# Patient Record
Sex: Female | Born: 1995 | Race: White | Hispanic: No | Marital: Single | Smoking: Never smoker
Health system: Southern US, Community
[De-identification: ages and names within clinical notes are randomized; demographics above are authoritative.]

## PROBLEM LIST (undated history)

## (undated) DIAGNOSIS — E538 Deficiency of other specified B group vitamins: Secondary | ICD-10-CM

## (undated) DIAGNOSIS — T7840XA Allergy, unspecified, initial encounter: Secondary | ICD-10-CM

## (undated) DIAGNOSIS — G43909 Migraine, unspecified, not intractable, without status migrainosus: Secondary | ICD-10-CM

## (undated) DIAGNOSIS — E559 Vitamin D deficiency, unspecified: Secondary | ICD-10-CM

## (undated) DIAGNOSIS — D509 Iron deficiency anemia, unspecified: Secondary | ICD-10-CM

## (undated) HISTORY — PX: OTHER SURGICAL HISTORY: SHX169

## (undated) HISTORY — DX: Vitamin D deficiency, unspecified: E55.9

## (undated) HISTORY — DX: Deficiency of other specified B group vitamins: E53.8

## (undated) HISTORY — DX: Iron deficiency anemia, unspecified: D50.9

## (undated) HISTORY — DX: Migraine, unspecified, not intractable, without status migrainosus: G43.909

---
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
## 2017-05-22 ENCOUNTER — Other Ambulatory Visit: Payer: Self-pay | Admitting: Neurology

## 2017-05-22 DIAGNOSIS — G44221 Chronic tension-type headache, intractable: Secondary | ICD-10-CM

## 2017-05-30 ENCOUNTER — Ambulatory Visit: Payer: No Typology Code available for payment source

## 2017-06-10 ENCOUNTER — Ambulatory Visit
Admission: RE | Admit: 2017-06-10 | Discharge: 2017-06-10 | Disposition: A | Discharge status: Home / Self Care | Payer: No Typology Code available for payment source | Source: Ambulatory Visit | Attending: Neurology | Admitting: Neurology

## 2017-06-10 ENCOUNTER — Other Ambulatory Visit: Payer: Self-pay | Admitting: Neurology

## 2017-06-10 DIAGNOSIS — G44221 Chronic tension-type headache, intractable: Secondary | ICD-10-CM | POA: Insufficient documentation

## 2017-06-10 MED ORDER — GADOBENATE DIMEGLUMINE 529 MG/ML IV SOLN: 20.0000 mL | Freq: Once | INTRAVENOUS | Status: DC | PRN

## 2017-12-30 ENCOUNTER — Encounter: Payer: Self-pay | Admitting: Internal Medicine

## 2017-12-30 ENCOUNTER — Ambulatory Visit (INDEPENDENT_AMBULATORY_CARE_PROVIDER_SITE_OTHER): Payer: 59 | Admitting: Internal Medicine

## 2017-12-30 VITALS — BP 118/76 | HR 76 | Resp 16 | Ht 67.0 in | Wt 193.0 lb

## 2017-12-30 DIAGNOSIS — R300 Dysuria: Secondary | ICD-10-CM | POA: Diagnosis not present

## 2017-12-30 DIAGNOSIS — Z0001 Encounter for general adult medical examination with abnormal findings: Secondary | ICD-10-CM | POA: Diagnosis not present

## 2017-12-30 DIAGNOSIS — R040 Epistaxis: Secondary | ICD-10-CM | POA: Diagnosis not present

## 2017-12-30 DIAGNOSIS — N921 Excessive and frequent menstruation with irregular cycle: Secondary | ICD-10-CM | POA: Diagnosis not present

## 2017-12-30 MED ORDER — NORGESTIM-ETH ESTRAD TRIPHASIC 0.18/0.215/0.25 MG-25 MCG PO TABS: 1.0000 | ORAL_TABLET | Freq: Every day | ORAL | 4 refills | Status: DC

## 2017-12-30 NOTE — Progress Notes (Signed)
[facility] [address] [place], [place] [address]  Internal MEDICINE  Office Visit Note  Patient Name: Claudia LisLindsay B Melendez  [id number] 26, 1997  [id number]  Date of Service: 12/30/2017  Chief Complaint  Patient presents with  . Annual Exam  . Irregular cycle  . Nose bleeds     HPI Pt is here for routine health maintenance examination. 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 She has irrgular cycle for last one year. She has used flonase in the past for allergies. Now has nose bleeds 2- 4 times per week.   Current Medication: Outpatient Encounter Medications as of 12/30/2017  Medication Sig  . Calcium Carbonate-Vit D-Min (CALCIUM 1200 PO) Take by mouth.  . Cholecalciferol (VITAMIN D3) 1000 units CAPS Take by mouth.  . Cyanocobalamin (B-12) 1000 MCG CAPS Take by mouth.  . Multiple Vitamin (MULTIVITAMIN) tablet Take 1 tablet by mouth daily.  . Norgestimate-Ethinyl Estradiol Triphasic (ORTHO TRI-CYCLEN LO) 0.18/0.215/0.25 MG-25 MCG tab Take 1 tablet by mouth daily.   No facility-administered encounter medications on file as of 12/30/2017.     Surgical History: Past Surgical History:  Procedure Laterality Date  . corrective eye surgery    . toncillectomy      Medical History: Past Medical History:  Diagnosis Date  . B12 deficiency   . Iron (Fe) deficiency anemia   . Vitamin D deficiency     Family History: Family History  Problem Relation Age of Onset  . Cancer Father        mouth    Review of Systems  Constitutional: Negative for chills, fatigue and unexpected weight change.  HENT: Positive for nosebleeds, postnasal drip and sinus pain. Negative for congestion, rhinorrhea, sneezing and sore throat.   Eyes: Negative for redness.  Respiratory: Negative for cough, chest tightness and shortness of breath.   Cardiovascular: Negative for chest pain and palpitations.  Gastrointestinal: Negative for abdominal pain, constipation, diarrhea, nausea and vomiting.   Genitourinary: Positive for menstrual problem. Negative for dysuria and frequency.  Musculoskeletal: Negative for arthralgias, back pain, joint swelling and neck pain.  Skin: Negative for rash.  Neurological: Negative.  Negative for tremors and numbness.  Hematological: Negative for adenopathy. Does not bruise/bleed easily.  Psychiatric/Behavioral: Negative for behavioral problems (Depression), sleep disturbance and suicidal ideas. The patient is not nervous/anxious.     Vital Signs: BP 118/76 (BP Location: Left Arm, Patient Position: Sitting)   Pulse 76   Resp 16   Ht 5\' 7"  (1.702 m)   Wt 193 lb (87.5 kg)   SpO2 99%   BMI 30.23 kg/m    Physical Exam  Constitutional: She is oriented to person, place, and time. She appears well-developed and well-nourished. No distress.  HENT:  Head: Normocephalic and atraumatic.  Mouth/Throat: Oropharynx is clear and moist. No oropharyngeal exudate.  Eyes: EOM are normal. Pupils are equal, round, and reactive to light.  Neck: Normal range of motion. Neck supple. No JVD present. No tracheal deviation present. No thyromegaly present.  Cardiovascular: Normal rate, regular rhythm and normal heart sounds. Exam reveals no gallop and no friction rub.  No murmur heard. Pulmonary/Chest: Effort normal. No respiratory distress. She has no wheezes. She has no rales. She exhibits no tenderness.  Abdominal: Soft. Bowel sounds are normal.  Musculoskeletal: Normal range of motion.  Lymphadenopathy:    She has no cervical adenopathy.  Neurological: She is alert and oriented to person, place, and time. No cranial nerve deficit.  Skin: Skin is warm and  dry. She is not diaphoretic.  Psychiatric: She has a normal mood and affect. Her behavior is normal. Judgment and thought content normal.    Assessment/Plan: 1. Encounter for general adult medical examination with abnormal findings - Breast exam  2. Dysuria - Urinalysis, Routine w reflex microscopic  3.  Frequent nosebleeds - Ambulatory referral to ENT  4. Excessive and frequent menstruation with irregular cycle - Multiple Vitamin (MULTIVITAMIN) tablet; Take 1 tablet by mouth daily. - Cyanocobalamin (B-12) 1000 MCG CAPS; Take by mouth. - TSH+T4F+T3Free - FSH/LH - CBC With Differential - Fe+TIBC+Fer - Norgestimate-Ethinyl Estradiol Triphasic (ORTHO TRI-CYCLEN LO) 0.18/0.215/0.25 MG-25 MCG tab; Take 1 tablet by mouth daily.  Dispense: 3 Package; Refill: 4  General Counseling: Claryce verbalizes understanding of the findings of todays visit and agrees with plan of treatment. I have discussed any further diagnostic evaluation that may be needed or ordered today. We also reviewed her medications today. she has been encouraged to call the office with any questions or concerns that should arise related to todays visit.    Orders Placed This Encounter  Procedures  . Urinalysis, Routine w reflex microscopic  . TSH+T4F+T3Free  . FSH/LH  . CBC With Differential  . Fe+TIBC+Fer  . Ambulatory referral to ENT    Meds ordered this encounter  Medications  . Norgestimate-Ethinyl Estradiol Triphasic (ORTHO TRI-CYCLEN LO) 0.18/0.215/0.25 MG-25 MCG tab    Sig: Take 1 tablet by mouth daily.    Dispense:  3 Package    Refill:  4    Time spent:30 Minutes      Lyndon Code, MD  Internal Medicine

## 2017-12-31 LAB — FSH/LH
FSH: 4.7 m[IU]/mL
LH: 12.9 m[IU]/mL

## 2017-12-31 LAB — CBC WITH DIFFERENTIAL
Basophils Absolute: 0.1 10*3/uL (ref 0.0–0.2)
Basos: 1 %
EOS (ABSOLUTE): 0.1 10*3/uL (ref 0.0–0.4)
EOS: 2 %
HEMATOCRIT: 37.2 % (ref 34.0–46.6)
Hemoglobin: 12.3 g/dL (ref 11.1–15.9)
IMMATURE GRANULOCYTES: 0 %
Immature Grans (Abs): 0 10*3/uL (ref 0.0–0.1)
LYMPHS ABS: 1.7 10*3/uL (ref 0.7–3.1)
Lymphs: 34 %
MCH: 29.8 pg (ref 26.6–33.0)
MCHC: 33.1 g/dL (ref 31.5–35.7)
MCV: 90 fL (ref 79–97)
MONOS ABS: 0.6 10*3/uL (ref 0.1–0.9)
Monocytes: 12 %
NEUTROS ABS: 2.6 10*3/uL (ref 1.4–7.0)
Neutrophils: 51 %
RBC: 4.13 x10E6/uL (ref 3.77–5.28)
RDW: 13.8 % (ref 12.3–15.4)
WBC: 5.1 10*3/uL (ref 3.4–10.8)

## 2017-12-31 LAB — URINALYSIS, ROUTINE W REFLEX MICROSCOPIC
BILIRUBIN UA: NEGATIVE
Glucose, UA: NEGATIVE
KETONES UA: NEGATIVE
Leukocytes, UA: NEGATIVE
Nitrite, UA: NEGATIVE
PH UA: 6 (ref 5.0–7.5)
PROTEIN UA: NEGATIVE
RBC, UA: NEGATIVE
SPEC GRAV UA: 1.018 (ref 1.005–1.030)
UUROB: 0.2 mg/dL (ref 0.2–1.0)

## 2017-12-31 LAB — IRON,TIBC AND FERRITIN PANEL
Ferritin: 15 ng/mL (ref 15–77)
Iron Saturation: 11 % — ABNORMAL LOW (ref 15–55)
Iron: 37 ug/dL (ref 27–159)
Total Iron Binding Capacity: 337 ug/dL (ref 250–450)
UIBC: 300 ug/dL (ref 131–425)

## 2017-12-31 LAB — TSH+T4F+T3FREE
Free T4: 1.32 ng/dL (ref 0.93–1.60)
T3 FREE: 3 pg/mL (ref 2.3–5.0)
TSH: 1.74 u[IU]/mL (ref 0.450–4.500)

## 2018-02-25 ENCOUNTER — Telehealth: Payer: Self-pay

## 2018-03-06 NOTE — Telephone Encounter (Signed)
Pt advised labs look good discuss in details on next appt

## 2018-05-05 ENCOUNTER — Encounter (INDEPENDENT_AMBULATORY_CARE_PROVIDER_SITE_OTHER): Payer: Self-pay

## 2018-05-05 ENCOUNTER — Encounter: Payer: Self-pay | Admitting: Internal Medicine

## 2018-05-05 ENCOUNTER — Ambulatory Visit: Payer: 59 | Admitting: Internal Medicine

## 2018-05-05 VITALS — BP 122/80 | HR 103 | Resp 16 | Ht 68.0 in | Wt 199.8 lb

## 2018-05-05 DIAGNOSIS — J302 Other seasonal allergic rhinitis: Secondary | ICD-10-CM

## 2018-05-05 DIAGNOSIS — R233 Spontaneous ecchymoses: Secondary | ICD-10-CM | POA: Diagnosis not present

## 2018-05-05 DIAGNOSIS — R51 Headache: Secondary | ICD-10-CM

## 2018-05-05 DIAGNOSIS — R519 Headache, unspecified: Secondary | ICD-10-CM

## 2018-05-05 MED ORDER — FLUTICASONE PROPIONATE 50 MCG/ACT NA SUSP: 2.0000 | Freq: Every day | NASAL | 6 refills | Status: DC

## 2018-05-05 MED ORDER — MONTELUKAST SODIUM 10 MG PO TABS: 10.0000 mg | ORAL_TABLET | Freq: Every day | ORAL | 3 refills | Status: DC

## 2018-05-05 MED ORDER — SUMATRIPTAN SUCCINATE 25 MG PO TABS: 25.0000 mg | ORAL_TABLET | Freq: Once | ORAL | 0 refills | Status: DC

## 2018-05-05 NOTE — Progress Notes (Signed)
[facility] [address] [place], [place] [address]  Internal MEDICINE  Office Visit Note  Patient Name: Claudia Melendez  [id number]  [id number]  Date of Service: 05/05/2018  Chief Complaint  Patient presents with  . Headache    bp was low at urgent care 98/60  . Contraception  Pt is here for routine follow up.   HPI Pt here for follow up.  She reports headaches that have been increasing over the last 3 weeks.  She reports having to lay down, and rest and the headaches are beginning to last over 24 hours. Headaches are worse in [place] due to Allergy and pollens.  She has seen a neurologist in the past, and denies wanting to explore that further at this time.  She went to urgent care of the weekend for petechia on her upper arms.  Urgent care asked her to have blood work with her PMD.  She was also started on OCP, no side effects notices    Current Medication: Outpatient Encounter Medications as of 05/05/2018  Medication Sig  . ibuprofen (ADVIL,MOTRIN) 200 MG tablet Take 800 mg by mouth every 6 (six) hours as needed.  . Multiple Vitamin (MULTIVITAMIN) tablet Take 1 tablet by mouth daily.  . Norgestimate-Ethinyl Estradiol Triphasic (ORTHO TRI-CYCLEN LO) 0.18/0.215/0.25 MG-25 MCG tab Take 1 tablet by mouth daily.  . pseudoephedrine (SUDAFED) 30 MG tablet Take 30 mg by mouth every 4 (four) hours as needed for congestion.  . Calcium Carbonate-Vit D-Min (CALCIUM 1200 PO) Take by mouth.  . Cholecalciferol (VITAMIN D3) 1000 units CAPS Take by mouth.  . Cyanocobalamin (B-12) 1000 MCG CAPS Take by mouth.  . fluticasone (FLONASE) 50 MCG/ACT nasal spray Place 2 sprays into both nostrils daily.  . montelukast (SINGULAIR) 10 MG tablet Take 1 tablet (10 mg total) by mouth at bedtime.  . SUMAtriptan (IMITREX) 25 MG tablet Take 1 tablet (25 mg total) by mouth once for 1 dose. May repeat in 2 hours if headache persists or recurs.   No facility-administered encounter medications on file as of  05/05/2018.     Surgical History: Past Surgical History:  Procedure Laterality Date  . corrective eye surgery    . toncillectomy      Medical History: Past Medical History:  Diagnosis Date  . B12 deficiency   . Iron (Fe) deficiency anemia   . Migraines   . Vitamin D deficiency     Family History: Family History  Problem Relation Age of Onset  . Cancer Father        mouth    Social History   Socioeconomic History  . Marital status: Single    Spouse name: Not on file  . Number of children: Not on file  . Years of education: Not on file  . Highest education level: Not on file  Occupational History  . Not on file  Social Needs  . Financial resource strain: Not on file  . Food insecurity:    Worry: Not on file    Inability: Not on file  . Transportation needs:    Medical: Not on file    Non-medical: Not on file  Tobacco Use  . Smoking status: Never Smoker  . Smokeless tobacco: Never Used  Substance and Sexual Activity  . Alcohol use: No    Frequency: Never  . Drug use: No  . Sexual activity: Not on file  Lifestyle  . Physical activity:    Days per week: Not on file  Minutes per session: Not on file  . Stress: Not on file  Relationships  . Social connections:    Talks on phone: Not on file    Gets together: Not on file    Attends religious service: Not on file    Active member of club or organization: Not on file    Attends meetings of clubs or organizations: Not on file    Relationship status: Not on file  . Intimate partner violence:    Fear of current or ex partner: Not on file    Emotionally abused: Not on file    Physically abused: Not on file    Forced sexual activity: Not on file  Other Topics Concern  . Not on file  Social History Narrative  . Not on file      Review of Systems  Constitutional: Negative for chills, diaphoresis and fatigue.  HENT: Negative for ear pain, postnasal drip and sinus pressure.   Eyes: Negative for  photophobia, discharge, redness, itching and visual disturbance.  Respiratory: Negative for cough, shortness of breath and wheezing.   Cardiovascular: Negative for chest pain, palpitations and leg swelling.  Gastrointestinal: Negative for abdominal pain, constipation, diarrhea, nausea and vomiting.  Genitourinary: Negative for dysuria and flank pain.  Musculoskeletal: Negative for arthralgias, back pain, gait problem and neck pain.  Skin: Negative for color change.  Allergic/Immunologic: Negative for environmental allergies and food allergies.  Neurological: Negative for dizziness and headaches.  Hematological: Does not bruise/bleed easily.  Psychiatric/Behavioral: Negative for agitation, behavioral problems (depression) and hallucinations.    Vital Signs: BP 122/80   Pulse (!) 103   Resp 16   Ht 5\' 8"  (1.727 m)   Wt 199 lb 12.8 oz (90.6 kg)   SpO2 98%   BMI 30.38 kg/m    Physical Exam  Constitutional: She is oriented to person, place, and time. She appears well-developed and well-nourished. No distress.  HENT:  Head: Normocephalic and atraumatic.  Mouth/Throat: Oropharynx is clear and moist. No oropharyngeal exudate.  Eyes: Pupils are equal, round, and reactive to light. EOM are normal.  Neck: Normal range of motion. Neck supple. No JVD present. No tracheal deviation present. No thyromegaly present.  Cardiovascular: Normal rate, regular rhythm and normal heart sounds. Exam reveals no gallop and no friction rub.  No murmur heard. Pulmonary/Chest: Effort normal. No respiratory distress. She has no wheezes. She has no rales. She exhibits no tenderness.  Abdominal: Soft. Bowel sounds are normal.  Musculoskeletal: Normal range of motion.  Lymphadenopathy:    She has no cervical adenopathy.  Neurological: She is alert and oriented to person, place, and time. No cranial nerve deficit.  Skin: Skin is warm and dry. Petechiae noted. She is not diaphoretic.     Psychiatric: She has a  normal mood and affect. Her behavior is normal. Judgment and thought content normal.   Assessment/Plan: 1. Petechiae Petechia present x 2 weeks.  Will get appropriate lab work.  - CBC With Differential - Platelet count  2. Intractable headache, unspecified chronicity pattern, unspecified headache type Use Imitrex as needed for abortive therapy for headaches. Will consider changing birth control in 4 weeks if symptoms get worse or do not improve.   - SUMAtriptan (IMITREX) 25 MG tablet; Take 1 tablet (25 mg total) by mouth once for 1 dose. May repeat in 2 hours if headache persists or recurs.  Dispense: 10 tablet; Refill: 0  3. Seasonal allergies Treat seasonal allergies as discussed.  Will follow up in  4 weeks.  - fluticasone (FLONASE) 50 MCG/ACT nasal spray; Place 2 sprays into both nostrils daily.  Dispense: 16 g; Refill: 6 - montelukast (SINGULAIR) 10 MG tablet; Take 1 tablet (10 mg total) by mouth at bedtime.  Dispense: 30 tablet; Refill: 3  General Counseling: Lillia AbedLindsay verbalizes understanding of the findings of todays visit and agrees with plan of treatment. I have discussed any further diagnostic evaluation that may be needed or ordered today. We also reviewed her medications today. she has been encouraged to call the office with any questions or concerns that should arise related to todays visit.    Orders Placed This Encounter  Procedures  . CBC With Differential  . Platelet count    Meds ordered this encounter  Medications  . SUMAtriptan (IMITREX) 25 MG tablet    Sig: Take 1 tablet (25 mg total) by mouth once for 1 dose. May repeat in 2 hours if headache persists or recurs.    Dispense:  10 tablet    Refill:  0  . fluticasone (FLONASE) 50 MCG/ACT nasal spray    Sig: Place 2 sprays into both nostrils daily.    Dispense:  16 g    Refill:  6  . montelukast (SINGULAIR) 10 MG tablet    Sig: Take 1 tablet (10 mg total) by mouth at bedtime.    Dispense:  30 tablet    Refill:   3    Time spent: 7225 Minutes   Dr Lyndon CodeFozia M Verlene Glantz Internal medicine

## 2018-05-07 LAB — CBC WITH DIFFERENTIAL
BASOS: 1 %
Basophils Absolute: 0.1 10*3/uL (ref 0.0–0.2)
EOS (ABSOLUTE): 0.1 10*3/uL (ref 0.0–0.4)
Eos: 2 %
HEMATOCRIT: 37.3 % (ref 34.0–46.6)
Hemoglobin: 12.6 g/dL (ref 11.1–15.9)
Immature Grans (Abs): 0 10*3/uL (ref 0.0–0.1)
Immature Granulocytes: 0 %
LYMPHS: 34 %
Lymphocytes Absolute: 2.1 10*3/uL (ref 0.7–3.1)
MCH: 30.3 pg (ref 26.6–33.0)
MCHC: 33.8 g/dL (ref 31.5–35.7)
MCV: 90 fL (ref 79–97)
MONOCYTES: 10 %
Monocytes Absolute: 0.6 10*3/uL (ref 0.1–0.9)
NEUTROS ABS: 3.2 10*3/uL (ref 1.4–7.0)
NEUTROS PCT: 53 %
RBC: 4.16 x10E6/uL (ref 3.77–5.28)
RDW: 13.7 % (ref 12.3–15.4)
WBC: 6.2 10*3/uL (ref 3.4–10.8)

## 2018-05-07 NOTE — Progress Notes (Signed)
PT WAS NOTIFIED OF LAB RESULTS.

## 2018-06-05 ENCOUNTER — Ambulatory Visit: Payer: Self-pay | Admitting: Adult Health

## 2019-01-15 ENCOUNTER — Other Ambulatory Visit: Payer: Self-pay | Admitting: Internal Medicine

## 2019-01-15 DIAGNOSIS — N921 Excessive and frequent menstruation with irregular cycle: Secondary | ICD-10-CM

## 2019-01-19 ENCOUNTER — Other Ambulatory Visit: Payer: Self-pay | Admitting: Internal Medicine

## 2019-01-19 DIAGNOSIS — N921 Excessive and frequent menstruation with irregular cycle: Secondary | ICD-10-CM

## 2019-12-21 ENCOUNTER — Ambulatory Visit: Payer: 59 | Admitting: Internal Medicine

## 2019-12-21 ENCOUNTER — Encounter: Payer: Self-pay | Admitting: Internal Medicine

## 2019-12-21 ENCOUNTER — Telehealth: Payer: Self-pay

## 2019-12-21 DIAGNOSIS — J0100 Acute maxillary sinusitis, unspecified: Secondary | ICD-10-CM | POA: Diagnosis not present

## 2019-12-21 DIAGNOSIS — R0602 Shortness of breath: Secondary | ICD-10-CM

## 2019-12-21 MED ORDER — AMOXICILLIN 500 MG PO CAPS: 500.0000 mg | ORAL_CAPSULE | Freq: Three times a day (TID) | ORAL | 0 refills | Status: DC

## 2019-12-21 NOTE — Progress Notes (Signed)
[facility] [place], [place] [address]  Internal MEDICINE  Telephone Visit  Patient Name: Claudia Melendez  [id number]  [id number]  Date of Service: 12/21/2019  I connected with the patient at 1135 by telephone and verified the patients identity using two identifiers.   I discussed the limitations, risks, security and privacy concerns of performing an evaluation and management service by telephone and the availability of in person appointments. I also discussed with the patient that there may be a patient responsible charge related to the service.  The patient expressed understanding and agrees to proceed.    Chief Complaint  Patient presents with  . Telephone Assessment    tightness on chest   . Telephone Screen    since yeaterday   . Shortness of Breath  . Sore Throat  . 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 Calcium Carbonate-Vit D-Min (CALCIUM 1200 PO) Take by mouth.  . Cholecalciferol (VITAMIN D3) 1000 units CAPS Take by mouth.  . Cyanocobalamin (B-12) 1000 MCG CAPS Take by mouth.  . fluticasone (FLONASE) 50 MCG/ACT nasal spray Place 2 sprays into both nostrils daily.  Marland Kitchen ibuprofen (ADVIL,MOTRIN) 200 MG tablet Take 800 mg by mouth every 6 (six) hours as needed.  . Multiple Vitamin (MULTIVITAMIN) tablet Take 1 tablet by mouth daily.  . pseudoephedrine (SUDAFED) 30 MG tablet Take 30 mg by mouth every 4 (four) hours as needed for congestion.  . TRI-LO-MARZIA 0.18/0.215/0.25 MG-25 MCG tab TAKE 1 TABLET BY MOUTH EVERY DAY  . amoxicillin (AMOXIL) 500 MG capsule Take 1 capsule (500 mg total) by mouth 3 (three) times daily.   . [DISCONTINUED] montelukast (SINGULAIR) 10 MG tablet Take 1 tablet (10 mg total) by mouth at bedtime. (Patient not taking: Reported on 12/21/2019)  . [DISCONTINUED] SUMAtriptan (IMITREX) 25 MG tablet Take 1 tablet (25 mg total) by mouth once for 1 dose. May repeat in 2 hours if headache persists or recurs.   No facility-administered encounter medications on file as of 12/21/2019.    Surgical History: Past Surgical History:  Procedure Laterality Date  . corrective eye surgery    . toncillectomy      Medical History: Past Medical History:  Diagnosis Date  . B12 deficiency   . Iron (Fe) deficiency anemia   . Migraines   . Vitamin D deficiency     Family History: Family History  Problem Relation Age of Onset  . Cancer Father        mouth    Social History   Socioeconomic History  . Marital status: Single    Spouse name: Not on file  . Number of children: Not on file  . Years of education: Not on file  . Highest education level: Not on file  Occupational History  . Not on file  Tobacco Use  . Smoking status: Never Smoker  . Smokeless tobacco: Never Used  Substance and Sexual Activity  . Alcohol use: No  . Drug use: No  . Sexual activity: Not on file  Other Topics Concern  . Not on file  Social History Narrative  . Not on file   Social Determinants  of Health   Financial Resource Strain:   . Difficulty of Paying Living Expenses: Not on file  Food Insecurity:   . Worried About Programme researcher, broadcasting/film/video in the Last Year: Not on file  . Ran Out of Food in the Last Year: Not on file  Transportation Needs:   . Lack of Transportation (Medical): Not on file  . Lack of Transportation (Non-Medical): Not on file  Physical Activity:   . Days of Exercise per Week: Not on file  . Minutes of Exercise per Session: Not on file  Stress:   . Feeling of Stress : Not on file  Social Connections:   . Frequency of Communication with Friends and Family: Not on file  . Frequency of  Social Gatherings with Friends and Family: Not on file  . Attends Religious Services: Not on file  . Active Member of Clubs or Organizations: Not on file  . Attends Banker Meetings: Not on file  . Marital Status: Not on file  Intimate Partner Violence:   . Fear of Current or Ex-Partner: Not on file  . Emotionally Abused: Not on file  . Physically Abused: Not on file  . Sexually Abused: Not on file      Review of Systems  Constitutional: Negative for fatigue and fever.  HENT: Positive for congestion, sinus pressure and sore throat.   Eyes: Negative.   Respiratory: Positive for chest tightness.   Cardiovascular: Negative.   Allergic/Immunologic: Positive for environmental allergies.  Neurological: Negative.     Vital Signs: Pulse (!) 111   Temp 98 F (36.7 C)   Ht 5' 7.5" (1.715 m)   Wt 196 lb (88.9 kg)   BMI 30.24 kg/m    Observation/Objective: Pt seems to be congested but NAD   Assessment/Plan: 1. Acute non-recurrent maxillary sinusitis Pt is instructed to use nasal saline spray periodically - amoxicillin (AMOXIL) 500 MG capsule; Take 1 capsule (500 mg total) by mouth 3 (three) times daily.  Dispense: 21 capsule; Refill: 0  2. Shortness of breath Samples of Dulera is given ( one to two puffs bid, rinse mouth afterwards)  General Counseling: Dellis Filbert understanding of the findings of today's phone visit and agrees with plan of treatment. I have discussed any further diagnostic evaluation that may be needed or ordered today. We also reviewed her medications today. she has been encouraged to call the office with any questions or concerns that should arise related to todays visit. Samples of Dulera 200 2 puffs Bid prn for cough and congestion    Meds ordered this encounter  Medications  . amoxicillin (AMOXIL) 500 MG capsule    Sig: Take 1 capsule (500 mg total) by mouth 3 (three) times daily.    Dispense:  21 capsule    Refill:  0    Time  spent:15 Minutes Dr Lyndon Code Internal medicine

## 2019-12-21 NOTE — Telephone Encounter (Signed)
Spoke with patient as per dr Welton Flakes gave samples for dulera 200 mcg/36mcg inhale 2 puffs twice a day and rinse mouth afterwards and sample ready for pickup

## 2019-12-22 ENCOUNTER — Other Ambulatory Visit: Payer: Self-pay

## 2019-12-22 ENCOUNTER — Emergency Department: Payer: PRIVATE HEALTH INSURANCE

## 2019-12-22 ENCOUNTER — Ambulatory Visit: Payer: 59 | Admitting: Adult Health

## 2019-12-22 DIAGNOSIS — Z79899 Other long term (current) drug therapy: Secondary | ICD-10-CM | POA: Insufficient documentation

## 2019-12-22 DIAGNOSIS — Z20822 Contact with and (suspected) exposure to covid-19: Secondary | ICD-10-CM | POA: Diagnosis not present

## 2019-12-22 DIAGNOSIS — J40 Bronchitis, not specified as acute or chronic: Secondary | ICD-10-CM | POA: Diagnosis not present

## 2019-12-22 DIAGNOSIS — R079 Chest pain, unspecified: Secondary | ICD-10-CM | POA: Diagnosis present

## 2019-12-22 DIAGNOSIS — Z793 Long term (current) use of hormonal contraceptives: Secondary | ICD-10-CM | POA: Diagnosis not present

## 2019-12-22 LAB — CBC
HCT: 42.3 % (ref 36.0–46.0)
Hemoglobin: 14.5 g/dL (ref 12.0–15.0)
MCH: 30.8 pg (ref 26.0–34.0)
MCHC: 34.3 g/dL (ref 30.0–36.0)
MCV: 89.8 fL (ref 80.0–100.0)
Platelets: 342 10*3/uL (ref 150–400)
RBC: 4.71 MIL/uL (ref 3.87–5.11)
RDW: 11.9 % (ref 11.5–15.5)
WBC: 7.9 10*3/uL (ref 4.0–10.5)
nRBC: 0 % (ref 0.0–0.2)

## 2019-12-22 LAB — BASIC METABOLIC PANEL
Anion gap: 11 (ref 5–15)
BUN: 10 mg/dL (ref 6–20)
CO2: 22 mmol/L (ref 22–32)
Calcium: 9.6 mg/dL (ref 8.9–10.3)
Chloride: 106 mmol/L (ref 98–111)
Creatinine, Ser: 0.65 mg/dL (ref 0.44–1.00)
GFR calc Af Amer: >60 mL/min (ref >60)
GFR calc non Af Amer: >60 mL/min (ref >60)
Glucose, Bld: 104 mg/dL — ABNORMAL HIGH (ref 70–99)
Potassium: 3.8 mmol/L (ref 3.5–5.1)
Sodium: 139 mmol/L (ref 135–145)

## 2019-12-22 LAB — TROPONIN I (HIGH SENSITIVITY)
Troponin I (High Sensitivity): <2 ng/L (ref <18)
Troponin I (High Sensitivity): <2 ng/L (ref <18)

## 2019-12-22 LAB — POCT PREGNANCY, URINE: Preg Test, Ur: NEGATIVE

## 2019-12-22 MED ORDER — SODIUM CHLORIDE 0.9% FLUSH: 3.0000 mL | Freq: Once | INTRAVENOUS | Status: DC

## 2019-12-22 NOTE — ED Triage Notes (Signed)
Pt to the er for chest pressure and sob for 3 days. Pt saw MD through virtual visit. Pt has a hx of sinus issues. Pt was given an inhaler and antibiotics. Pt says tonight she began having chest pain and pain in the upper left arm. Pt denies any other medical hx.

## 2019-12-23 ENCOUNTER — Emergency Department
Admission: EM | Admit: 2019-12-23 | Discharge: 2019-12-23 | Disposition: A | Discharge status: Home / Self Care | Payer: PRIVATE HEALTH INSURANCE | Attending: Emergency Medicine | Admitting: Emergency Medicine

## 2019-12-23 DIAGNOSIS — R0789 Other chest pain: Secondary | ICD-10-CM

## 2019-12-23 DIAGNOSIS — J40 Bronchitis, not specified as acute or chronic: Secondary | ICD-10-CM

## 2019-12-23 HISTORY — DX: Allergy, unspecified, initial encounter: T78.40XA

## 2019-12-23 NOTE — Discharge Instructions (Addendum)
Finish the course of antibiotics prescribed by your primary care doctor, and continue to use the inhaler as prescribed.  Your COVID-19 test results should return within the next 24 to 48 hours, and you will receive a call about the result.  Return to the ER immediately for new, worsening, or persistent severe shortness of breath, chest pain, weakness, high fever, or any other new or worsening symptoms that concern you.

## 2019-12-23 NOTE — ED Provider Notes (Signed)
[facility] Emergency Department Provider Note ____________________________________________   First MD Initiated Contact with Patient 12/23/19 [phone]     (approximate)  I have reviewed the triage vital signs and the nursing notes.   HISTORY  Chief Complaint Chest Pain    HPI Claudia Melendez is a 24 y.o. female with PMH as noted below presents with chest pain, acute onset approximately 45 minutes prior to arrival, described as somewhat sharp, mainly substernal, but associated with some pain into the left shoulder.  It resolved around the time she arrived in the ED.  She has had some shortness of breath and chest tightness along with mild cough over the last several days, and had a telemedicine visit with her PMD yesterday.  She was prescribed a mometasone inhaler and amoxicillin.  She states that the breathing has improved with these.  She denies any prior history of this chest pain.  She has had no fever, vomiting, lightheadedness, or any leg swelling or pain.  She does report that she is on OCPs.  She does not smoke.  Past Medical History:  Diagnosis Date  . Allergies   . B12 deficiency   . Iron (Fe) deficiency anemia   . Migraines   . Vitamin D deficiency     Patient Active Problem List   Diagnosis Date Noted  . Chronic tension-type headache, intractable 05/22/2017    Past Surgical History:  Procedure Laterality Date  . corrective eye surgery    . toncillectomy      Prior to Admission medications   Medication Sig Start Date End Date Taking? Authorizing Provider  amoxicillin (AMOXIL) 500 MG capsule Take 1 capsule (500 mg total) by mouth 3 (three) times daily. 12/21/19   Lavera Guise, MD  Calcium Carbonate-Vit D-Min (CALCIUM 1200 PO) Take by mouth.    [provider]  Cholecalciferol (VITAMIN D3) 1000 units CAPS Take by mouth.    [provider]  Cyanocobalamin (B-12) 1000 MCG CAPS Take by mouth.    [provider]    fluticasone (FLONASE) 50 MCG/ACT nasal spray Place 2 sprays into both nostrils daily. 05/05/18   Lavera Guise, MD  ibuprofen (ADVIL,MOTRIN) 200 MG tablet Take 800 mg by mouth every 6 (six) hours as needed.    [provider]  Multiple Vitamin (MULTIVITAMIN) tablet Take 1 tablet by mouth daily.    [provider]  pseudoephedrine (SUDAFED) 30 MG tablet Take 30 mg by mouth every 4 (four) hours as needed for congestion.    [provider]  TRI-LO-MARZIA 0.18/0.215/0.25 MG-25 MCG tab TAKE 1 TABLET BY MOUTH EVERY DAY 01/15/19   Ronnell Freshwater, NP    Allergies Patient has no known allergies.  Family History  Problem Relation Age of Onset  . Cancer Father        mouth    Social History Social History   Tobacco Use  . Smoking status: Never Smoker  . Smokeless tobacco: Never Used  Substance Use Topics  . Alcohol use: No  . Drug use: No    Review of Systems  Constitutional: No fever/chills. Eyes: No redness. ENT: No neck pain. Cardiovascular: Positive for resolved chest pain. Respiratory: Positive for intermittent shortness of breath. Gastrointestinal: No vomiting or diarrhea.  Genitourinary: Negative for flank pain.  Musculoskeletal: Negative for back pain. Skin: Negative for rash. Neurological: Negative for headache.   ____________________________________________   PHYSICAL EXAM:  VITAL SIGNS: ED Triage Vitals  Enc Vitals Group  BP 12/22/19 2044 (!) 145/106     Pulse Rate 12/22/19 2044 92     Resp 12/22/19 2044 20     Temp 12/22/19 2044 98.6 F (37 C)     Temp Source 12/22/19 2044 Oral     SpO2 12/22/19 2044 100 %     Weight 12/22/19 2043 195 lb (88.5 kg)     Height 12/22/19 2043 5' 7.5" (1.715 m)     Head Circumference --      Peak Flow --      Pain Score 12/22/19 2043 0     Pain Loc --      Pain Edu? --      Excl. in GC? --     Constitutional: Alert and oriented. Well appearing and in no acute distress. Eyes: Conjunctivae  are normal.  Head: Atraumatic. Nose: No congestion/rhinnorhea. Mouth/Throat: Mucous membranes are moist.   Neck: Normal range of motion.  Cardiovascular: Normal rate, regular rhythm. Grossly normal heart sounds.  Good peripheral circulation. Respiratory: Normal respiratory effort.  No retractions. Lungs CTAB. Gastrointestinal:  No distention.  Musculoskeletal: No lower extremity edema.  No calf or popliteal swelling or tenderness.  Extremities warm and well perfused.  Neurologic:  Normal speech and language. No gross focal neurologic deficits are appreciated.  Skin:  Skin is warm and dry. No rash noted. Psychiatric: Mood and affect are normal. Speech and behavior are normal.  ____________________________________________   LABS (all labs ordered are listed, but only abnormal results are displayed)  Labs Reviewed  BASIC METABOLIC PANEL - Abnormal; Notable for the following components:      Result Value   Glucose, Bld 104 (*)    All other components within normal limits  NOVEL CORONAVIRUS, NAA (HOSP ORDER, SEND-OUT TO REF LAB; TAT 18-24 HRS)  CBC  POC URINE PREG, ED  POCT PREGNANCY, URINE  TROPONIN I (HIGH SENSITIVITY)  TROPONIN I (HIGH SENSITIVITY)   ____________________________________________  EKG  ED ECG REPORT I, Dionne Bucy, the attending physician, personally viewed and interpreted this ECG.  Date: 12/23/2019 EKG Time: 2052 Rate: 91 Rhythm: normal sinus rhythm QRS Axis: normal Intervals: Incomplete RBBB ST/T Wave abnormalities: normal Narrative Interpretation: no evidence of acute ischemia  ____________________________________________  RADIOLOGY  CXR: No focal infiltrate or other acute abnormality  ____________________________________________   PROCEDURES  Procedure(s) performed: No  Procedures  Critical Care performed: No ____________________________________________   INITIAL IMPRESSION / ASSESSMENT AND PLAN / ED COURSE  Pertinent labs &  imaging results that were available during my care of the patient were reviewed by me and considered in my medical decision making (see chart for details).  24 year old female with PMH as noted above and no cardiac history or risk factors presents with atypical chest pain that lasted about 45 minutes and has now resolved.  She has had some shortness of breath and bronchitis type symptoms over the last several days, and was prescribed an inhaler and antibiotics by her PMD yesterday which she states has improved to those symptoms.  On exam, the patient is well-appearing.  Her vital signs are normal except for mild hypertension.  She has no significant increased work of breathing or respiratory distress.  The lungs are clear bilaterally.  The remainder of the physical exam is unremarkable.  EKG shows no ischemic findings.  The chest x-ray is clear.  Lab work-up was obtained from triage and is all within normal limits.  The patient has had negative troponins x2.  Overall her risk for ACS is  extremely low, and given the reassuring EKG and negative troponins x2 as well as the resolved pain, there is no evidence of ACS.  Although the patient is on OCPs, given the lack of tachycardia or hypoxia, the spontaneously resolved pain, and the lack of specific risk factors, there is no evidence of PE or indication for further work-up.  I also do not suspect vascular etiology.  Overall presentation is most consistent with benign musculoskeletal type pain likely related to the patient's recent respiratory symptoms.  The patient is stable for discharge home.  I offered a COVID-19 swab, which the patient does want.  I gave her very thorough return precautions and she expressed understanding.  ____________________________  Claudia Melendez was evaluated in Emergency Department on 12/23/2019 for the symptoms described in the history of present illness. She was evaluated in the context of the global COVID-19 pandemic,  which necessitated consideration that the patient might be at risk for infection with the SARS-CoV-2 virus that causes COVID-19. Institutional protocols and algorithms that pertain to the evaluation of patients at risk for COVID-19 are in a state of rapid change based on information released by regulatory bodies including the CDC and federal and state organizations. These policies and algorithms were followed during the patient's care in the ED.  ____________________________________________   FINAL CLINICAL IMPRESSION(S) / ED DIAGNOSES  Final diagnoses:  Atypical chest pain  Bronchitis      NEW MEDICATIONS STARTED DURING THIS VISIT:  New Prescriptions   No medications on file     Note:  This document was prepared using Dragon voice recognition software and may include unintentional dictation errors.   Dionne Bucy, MD 12/23/19 [phone]

## 2019-12-24 LAB — NOVEL CORONAVIRUS, NAA (HOSP ORDER, SEND-OUT TO REF LAB; TAT 18-24 HRS): SARS-CoV-2, NAA: NOT DETECTED

## 2020-02-01 ENCOUNTER — Other Ambulatory Visit: Payer: Self-pay

## 2020-02-01 DIAGNOSIS — N921 Excessive and frequent menstruation with irregular cycle: Secondary | ICD-10-CM

## 2020-02-01 MED ORDER — NORGESTIM-ETH ESTRAD TRIPHASIC 0.18/0.215/0.25 MG-25 MCG PO TABS: 1.0000 | ORAL_TABLET | Freq: Every day | ORAL | 0 refills | Status: DC

## 2020-02-03 ENCOUNTER — Telehealth: Payer: Self-pay

## 2020-02-03 NOTE — Telephone Encounter (Signed)
Confirmed appointment on 02/07/2020 and screened for covid. [facility] 

## 2020-02-07 ENCOUNTER — Ambulatory Visit: Payer: 59 | Admitting: Adult Health

## 2020-02-07 ENCOUNTER — Other Ambulatory Visit: Payer: Self-pay

## 2020-02-07 ENCOUNTER — Ambulatory Visit
Admission: RE | Admit: 2020-02-07 | Discharge: 2020-02-07 | Disposition: A | Discharge status: Home / Self Care | Payer: Managed Care, Other (non HMO) | Source: Ambulatory Visit | Attending: Adult Health | Admitting: Adult Health

## 2020-02-07 ENCOUNTER — Encounter: Payer: Self-pay | Admitting: Adult Health

## 2020-02-07 VITALS — BP 140/84 | HR 104 | Temp 97.7°F | Resp 16 | Ht 67.5 in | Wt 216.0 lb

## 2020-02-07 DIAGNOSIS — R0781 Pleurodynia: Secondary | ICD-10-CM | POA: Diagnosis not present

## 2020-02-07 DIAGNOSIS — Z0001 Encounter for general adult medical examination with abnormal findings: Secondary | ICD-10-CM

## 2020-02-07 DIAGNOSIS — N921 Excessive and frequent menstruation with irregular cycle: Secondary | ICD-10-CM | POA: Diagnosis not present

## 2020-02-07 MED ORDER — NORGESTIM-ETH ESTRAD TRIPHASIC 0.18/0.215/0.25 MG-25 MCG PO TABS: 1.0000 | ORAL_TABLET | Freq: Every day | ORAL | 3 refills | Status: DC

## 2020-02-07 NOTE — Progress Notes (Signed)
[facility], [place] [address]  Internal MEDICINE  Office Visit Note  Patient Name: Claudia Melendez  [id number]  [id number]  Date of Service: 02/07/2020  Chief Complaint  Patient presents with  . Anemia  . Quality Metric Gaps    AWV ,pap smear  . Medication Refill    birth control  . Follow-up    sore in ribs going on few weeks   . Sinusitis    HPI  Pt is here for follow up.   Needs refill on birth control.  She is using to regulate her periods.  She is not sexually active now or in the past. She is complaining of bilateral soreness in her lower rib cage. 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 She reports feeling like this has worsened over the last year or two. She was checked for scoliosis as a kid.    Current Medication: Outpatient Encounter Medications as of 02/07/2020  Medication Sig  . amoxicillin (AMOXIL) 500 MG capsule Take 1 capsule (500 mg total) by mouth 3 (three) times daily.  . Calcium Carbonate-Vit D-Min (CALCIUM 1200 PO) Take by mouth.  . Cholecalciferol (VITAMIN D3) 1000 units CAPS Take by mouth.  . Cyanocobalamin (B-12) 1000 MCG CAPS Take by mouth.  . fluticasone (FLONASE) 50 MCG/ACT nasal spray Place 2 sprays into both nostrils daily.  Marland Kitchen ibuprofen (ADVIL,MOTRIN) 200 MG tablet Take 800 mg by mouth every 6 (six) hours as needed.  . Multiple Vitamin (MULTIVITAMIN) tablet Take 1 tablet by mouth daily.  . Norgestimate-Ethinyl Estradiol Triphasic (TRI-LO-MARZIA) 0.18/0.215/0.25 MG-25 MCG tab Take 1 tablet by mouth daily.  . pseudoephedrine (SUDAFED) 30 MG tablet Take 30 mg by mouth every 4 (four) hours as needed for congestion.  . [DISCONTINUED] Norgestimate-Ethinyl Estradiol Triphasic (TRI-LO-MARZIA) 0.18/0.215/0.25 MG-25 MCG tab Take 1 tablet by mouth daily.   No facility-administered encounter medications on file as of 02/07/2020.    Surgical History: Past Surgical History:  Procedure Laterality  Date  . corrective eye surgery    . toncillectomy      Medical History: Past Medical History:  Diagnosis Date  . Allergies   . B12 deficiency   . Iron (Fe) deficiency anemia   . Migraines   . Vitamin D deficiency     Family History: Family History  Problem Relation Age of Onset  . Cancer Father        mouth    Social History   Socioeconomic History  . Marital status: Single    Spouse name: Not on file  . Number of children: Not on file  . Years of education: Not on file  . Highest education level: Not on file  Occupational History  . Not on file  Tobacco Use  . Smoking status: Never Smoker  . Smokeless tobacco: Never Used  Substance and Sexual Activity  . Alcohol use: No  . Drug use: No  . Sexual activity: Not on file  Other Topics Concern  . Not on file  Social History Narrative  . Not on file   Social Determinants of Health   Financial Resource Strain:   . Difficulty of Paying Living Expenses:   Food Insecurity:   . Worried About Charity fundraiser in the Last Year:   . Arboriculturist in the Last Year:   Transportation Needs:   . Film/video editor (Medical):   Marland Kitchen Lack of Transportation (Non-Medical):   Physical Activity:   .  Days of Exercise per Week:   . Minutes of Exercise per Session:   Stress:   . Feeling of Stress :   Social Connections:   . Frequency of Communication with Friends and Family:   . Frequency of Social Gatherings with Friends and Family:   . Attends Religious Services:   . Active Member of Clubs or Organizations:   . Attends Banker Meetings:   Marland Kitchen Marital Status:   Intimate Partner Violence:   . Fear of Current or Ex-Partner:   . Emotionally Abused:   Marland Kitchen Physically Abused:   . Sexually Abused:       Review of Systems  Constitutional: Negative for chills, fatigue and unexpected weight change.  HENT: Negative for congestion, rhinorrhea, sneezing and sore throat.   Eyes: Negative for photophobia, pain and  redness.  Respiratory: Negative for cough, chest tightness and shortness of breath.   Cardiovascular: Negative for chest pain and palpitations.  Gastrointestinal: Negative for abdominal pain, constipation, diarrhea, nausea and vomiting.  Endocrine: Negative.   Genitourinary: Negative for dysuria and frequency.  Musculoskeletal: Negative for arthralgias, back pain, joint swelling and neck pain.  Skin: Negative for rash.  Allergic/Immunologic: Negative.   Neurological: Negative for tremors and numbness.  Hematological: Negative for adenopathy. Does not bruise/bleed easily.  Psychiatric/Behavioral: Negative for behavioral problems and sleep disturbance. The patient is not nervous/anxious.     Vital Signs: BP 140/84   Pulse (!) 104   Temp 97.7 F (36.5 C)   Resp 16   Ht 5' 7.5" (1.715 m)   Wt 216 lb (98 kg)   SpO2 96%   BMI 33.33 kg/m    Physical Exam Vitals and nursing note reviewed.  Constitutional:      General: She is not in acute distress.    Appearance: She is well-developed. She is not diaphoretic.  HENT:     Head: Normocephalic and atraumatic.     Mouth/Throat:     Pharynx: No oropharyngeal exudate.  Eyes:     Pupils: Pupils are equal, round, and reactive to light.  Neck:     Thyroid: No thyromegaly.     Vascular: No JVD.     Trachea: No tracheal deviation.  Cardiovascular:     Rate and Rhythm: Normal rate and regular rhythm.     Heart sounds: Normal heart sounds. No murmur. No friction rub. No gallop.   Pulmonary:     Effort: Pulmonary effort is normal. No respiratory distress.     Breath sounds: Normal breath sounds. No wheezing or rales.  Chest:     Chest wall: No tenderness.  Abdominal:     Palpations: Abdomen is soft.     Tenderness: There is no abdominal tenderness. There is no guarding.  Musculoskeletal:        General: Normal range of motion.     Cervical back: Normal range of motion and neck supple.  Lymphadenopathy:     Cervical: No cervical  adenopathy.  Skin:    General: Skin is warm and dry.  Neurological:     Mental Status: She is alert and oriented to person, place, and time.     Cranial Nerves: No cranial nerve deficit.  Psychiatric:        Behavior: Behavior normal.        Thought Content: Thought content normal.        Judgment: Judgment normal.    Assessment/Plan: 1. Excessive and frequent menstruation with irregular cycle Refilled birth control.  -  Norgestimate-Ethinyl Estradiol Triphasic (TRI-LO-MARZIA) 0.18/0.215/0.25 MG-25 MCG tab; Take 1 tablet by mouth daily.  Dispense: 90 tablet; Refill: 3  2. Rib pain X ray for scoliosis eval.  - DG SCOLIOSIS EVAL COMPLETE SPINE 2 OR 3 VIEWS; Future  3. Encounter for general adult medical examination with abnormal findings Labs for physical. - CBC with Differential/Platelet - Lipid Panel With LDL/HDL Ratio - TSH - T4, free - Comprehensive metabolic panel  General Counseling: Teniya verbalizes understanding of the findings of todays visit and agrees with plan of treatment. I have discussed any further diagnostic evaluation that may be needed or ordered today. We also reviewed her medications today. she has been encouraged to call the office with any questions or concerns that should arise related to todays visit.    Orders Placed This Encounter  Procedures  . DG SCOLIOSIS EVAL COMPLETE SPINE 2 OR 3 VIEWS  . CBC with Differential/Platelet  . Lipid Panel With LDL/HDL Ratio  . TSH  . T4, free  . Comprehensive metabolic panel    Meds ordered this encounter  Medications  . Norgestimate-Ethinyl Estradiol Triphasic (TRI-LO-MARZIA) 0.18/0.215/0.25 MG-25 MCG tab    Sig: Take 1 tablet by mouth daily.    Dispense:  90 tablet    Refill:  3    Please fill as generic preferred by her insurance.    Time spent: 30 Minutes   This patient was seen by Blima Ledger AGNP-C in Collaboration with Dr Lyndon Code as a part of collaborative care agreement     Johnna Acosta AGNP-C Internal medicine

## 2020-02-26 LAB — CBC WITH DIFFERENTIAL/PLATELET
Basophils Absolute: 0.1 10*3/uL (ref 0.0–0.2)
Basos: 1 %
EOS (ABSOLUTE): 0.1 10*3/uL (ref 0.0–0.4)
Eos: 2 %
Hematocrit: 40.6 % (ref 34.0–46.6)
Hemoglobin: 13.7 g/dL (ref 11.1–15.9)
Immature Grans (Abs): 0 10*3/uL (ref 0.0–0.1)
Immature Granulocytes: 0 %
Lymphocytes Absolute: 2.3 10*3/uL (ref 0.7–3.1)
Lymphs: 37 %
MCH: 30.3 pg (ref 26.6–33.0)
MCHC: 33.7 g/dL (ref 31.5–35.7)
MCV: 90 fL (ref 79–97)
Monocytes Absolute: 0.6 10*3/uL (ref 0.1–0.9)
Monocytes: 10 %
Neutrophils Absolute: 3 10*3/uL (ref 1.4–7.0)
Neutrophils: 50 %
Platelets: 329 10*3/uL (ref 150–450)
RBC: 4.52 x10E6/uL (ref 3.77–5.28)
RDW: 12.3 % (ref 11.7–15.4)
WBC: 6.1 10*3/uL (ref 3.4–10.8)

## 2020-02-26 LAB — COMPREHENSIVE METABOLIC PANEL
ALT: 33 IU/L — ABNORMAL HIGH (ref 0–32)
AST: 31 IU/L (ref 0–40)
Albumin/Globulin Ratio: 1.4 (ref 1.2–2.2)
Albumin: 4.2 g/dL (ref 3.9–5.0)
Alkaline Phosphatase: 42 IU/L (ref 39–117)
BUN/Creatinine Ratio: 14 (ref 9–23)
BUN: 11 mg/dL (ref 6–20)
Bilirubin Total: 0.5 mg/dL (ref 0.0–1.2)
CO2: 22 mmol/L (ref 20–29)
Calcium: 9.6 mg/dL (ref 8.7–10.2)
Chloride: 106 mmol/L (ref 96–106)
Creatinine, Ser: 0.78 mg/dL (ref 0.57–1.00)
GFR calc Af Amer: 126 mL/min/{1.73_m2} (ref >59)
GFR calc non Af Amer: 109 mL/min/{1.73_m2} (ref >59)
Globulin, Total: 3.1 g/dL (ref 1.5–4.5)
Glucose: 89 mg/dL (ref 65–99)
Potassium: 4.4 mmol/L (ref 3.5–5.2)
Sodium: 141 mmol/L (ref 134–144)
Total Protein: 7.3 g/dL (ref 6.0–8.5)

## 2020-02-26 LAB — T4, FREE: Free T4: 1.37 ng/dL (ref 0.82–1.77)

## 2020-02-26 LAB — LIPID PANEL WITH LDL/HDL RATIO
Cholesterol, Total: 152 mg/dL (ref 100–199)
HDL: 42 mg/dL (ref >39)
LDL Chol Calc (NIH): 87 mg/dL (ref 0–99)
LDL/HDL Ratio: 2.1 ratio (ref 0.0–3.2)
Triglycerides: 129 mg/dL (ref 0–149)
VLDL Cholesterol Cal: 23 mg/dL (ref 5–40)

## 2020-02-26 LAB — TSH: TSH: 1.53 u[IU]/mL (ref 0.450–4.500)

## 2020-03-08 ENCOUNTER — Telehealth: Payer: Self-pay

## 2020-03-08 NOTE — Telephone Encounter (Signed)
Confirmed and screened for 03-10-20 ov. 

## 2020-03-10 ENCOUNTER — Encounter: Payer: 59 | Admitting: Nurse Practitioner

## 2020-03-31 ENCOUNTER — Telehealth: Payer: Self-pay

## 2020-03-31 NOTE — Telephone Encounter (Signed)
Lmom to confirm and screen for 04-04-20 ov. 

## 2020-04-04 ENCOUNTER — Other Ambulatory Visit: Payer: Self-pay

## 2020-04-04 ENCOUNTER — Ambulatory Visit (INDEPENDENT_AMBULATORY_CARE_PROVIDER_SITE_OTHER): Payer: 59 | Admitting: Nurse Practitioner

## 2020-04-04 ENCOUNTER — Encounter: Payer: Self-pay | Admitting: Nurse Practitioner

## 2020-04-04 VITALS — BP 133/90 | HR 98 | Temp 97.9°F | Resp 16 | Ht 67.0 in | Wt 220.6 lb

## 2020-04-04 DIAGNOSIS — Z0001 Encounter for general adult medical examination with abnormal findings: Secondary | ICD-10-CM

## 2020-04-04 DIAGNOSIS — M4186 Other forms of scoliosis, lumbar region: Secondary | ICD-10-CM | POA: Diagnosis not present

## 2020-04-04 DIAGNOSIS — Z124 Encounter for screening for malignant neoplasm of cervix: Secondary | ICD-10-CM

## 2020-04-04 DIAGNOSIS — F321 Major depressive disorder, single episode, moderate: Secondary | ICD-10-CM

## 2020-04-04 DIAGNOSIS — R300 Dysuria: Secondary | ICD-10-CM
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
## 2020-04-05 LAB — URINALYSIS, ROUTINE W REFLEX MICROSCOPIC
Bilirubin, UA: NEGATIVE
Glucose, UA: NEGATIVE
Ketones, UA: NEGATIVE
Leukocytes,UA: NEGATIVE
Nitrite, UA: NEGATIVE
Protein,UA: NEGATIVE
RBC, UA: NEGATIVE
Specific Gravity, UA: 1.02 (ref 1.005–1.030)
Urobilinogen, Ur: 0.2 mg/dL (ref 0.2–1.0)
pH, UA: 5.5 (ref 5.0–7.5)

## 2020-04-06 LAB — IGP, APTIMA HPV: HPV Aptima: NEGATIVE

## 2020-04-07 ENCOUNTER — Telehealth: Payer: Self-pay

## 2020-04-07 NOTE — Progress Notes (Signed)
Please let the patient know that her pap smear was normal. Thanks.

## 2020-04-07 NOTE — Telephone Encounter (Signed)
Patient was notified.

## 2020-04-07 NOTE — Telephone Encounter (Signed)
-----   Message from Carlean Jews, NP sent at 04/07/2020  9:31 AM EDT ----- Please let the patient know that her pap smear was normal. Thanks.

## 2020-04-12 DIAGNOSIS — R300 Dysuria: Secondary | ICD-10-CM | POA: Insufficient documentation

## 2020-04-12 DIAGNOSIS — Z124 Encounter for screening for malignant neoplasm of cervix: Secondary | ICD-10-CM | POA: Insufficient documentation

## 2020-04-12 DIAGNOSIS — Z0001 Encounter for general adult medical examination with abnormal findings: Secondary | ICD-10-CM | POA: Insufficient documentation

## 2020-04-12 DIAGNOSIS — F321 Major depressive disorder, single episode, moderate: Secondary | ICD-10-CM | POA: Insufficient documentation

## 2020-04-12 DIAGNOSIS — M419 Scoliosis, unspecified: Secondary | ICD-10-CM | POA: Insufficient documentation

## 2020-04-24 ENCOUNTER — Ambulatory Visit: Payer: 59 | Admitting: Psychology

## 2020-04-25 ENCOUNTER — Other Ambulatory Visit: Payer: Self-pay

## 2020-04-25 DIAGNOSIS — N921 Excessive and frequent menstruation with irregular cycle: Secondary | ICD-10-CM

## 2020-04-25 MED ORDER — NORGESTIM-ETH ESTRAD TRIPHASIC 0.18/0.215/0.25 MG-25 MCG PO TABS: 1.0000 | ORAL_TABLET | Freq: Every day | ORAL | 1 refills | Status: DC

## 2020-05-09 ENCOUNTER — Telehealth: Payer: Self-pay

## 2020-05-09 NOTE — Telephone Encounter (Signed)
Faxed most recent xray to [facility] and placed in hold at front.

## 2020-10-03 ENCOUNTER — Ambulatory Visit: Payer: 59 | Admitting: Nurse Practitioner

## 2020-10-05 ENCOUNTER — Other Ambulatory Visit: Payer: Self-pay

## 2020-10-05 ENCOUNTER — Ambulatory Visit: Payer: Medicaid Other | Admitting: Nurse Practitioner

## 2020-10-05 ENCOUNTER — Encounter: Payer: Self-pay | Admitting: Nurse Practitioner

## 2020-10-05 VITALS — BP 142/88 | HR 99 | Temp 97.6°F | Resp 16 | Ht 67.0 in | Wt 209.2 lb

## 2020-10-05 DIAGNOSIS — F321 Major depressive disorder, single episode, moderate: Secondary | ICD-10-CM

## 2020-10-05 DIAGNOSIS — R030 Elevated blood-pressure reading, without diagnosis of hypertension: Secondary | ICD-10-CM | POA: Diagnosis not present

## 2020-10-05 DIAGNOSIS — M4185 Other forms of scoliosis, thoracolumbar region: Secondary | ICD-10-CM

## 2020-10-05 NOTE — Progress Notes (Signed)
[facility] [address] [place], [place] [address]  Internal MEDICINE  Office Visit Note  Patient Name: Claudia Melendez  [id number]  [id number]  Date of Service: 10/05/2020  Chief Complaint  Patient presents with  . Follow-up  . controlled substance form    Reviewed with PT    The patient is here for follow up visit. She has had some mental health issues and anxiety due to COVID 19. She had to finish her college education at home and there was no graduation ceremony. She did have an appointment with counselor/mental health provider through [facility]. She did have to cancel this appointment as she had gotten a job which interfered with her new job. She feels as though she would like to reestablish this connection and start mental health.       Current Medication: Outpatient Encounter Medications as of 10/05/2020  Medication Sig  . Calcium Carbonate-Vit D-Min (CALCIUM 1200 PO) Take by mouth.  . Cholecalciferol (VITAMIN D3) 1000 units CAPS Take by mouth.  . Cyanocobalamin (B-12) 1000 MCG CAPS Take by mouth.  Marland Kitchen ibuprofen (ADVIL,MOTRIN) 200 MG tablet Take 800 mg by mouth every 6 (six) hours as needed.  . Multiple Vitamin (MULTIVITAMIN) tablet Take 1 tablet by mouth daily.  . Norgestimate-Ethinyl Estradiol Triphasic (TRI-LO-MARZIA) 0.18/0.215/0.25 MG-25 MCG tab Take 1 tablet by mouth daily.  . pseudoephedrine (SUDAFED) 30 MG tablet Take 30 mg by mouth every 4 (four) hours as needed for congestion.   No facility-administered encounter medications on file as of 10/05/2020.    Surgical History: Past Surgical History:  Procedure Laterality Date  . corrective eye surgery    . toncillectomy      Medical History: Past Medical History:  Diagnosis Date  . Allergies   . B12 deficiency   . Iron (Fe) deficiency anemia   . Migraines   . Vitamin D deficiency     Family History: Family History  Problem Relation Age of Onset  . Cancer Father        mouth     Social History   Socioeconomic History  . Marital status: Single    Spouse name: Not on file  . Number of children: Not on file  . Years of education: Not on file  . Highest education level: Not on file  Occupational History  . Not on file  Tobacco Use  . Smoking status: Never Smoker  . Smokeless tobacco: Never Used  Vaping Use  . Vaping Use: Never used  Substance and Sexual Activity  . Alcohol use: No  . Drug use: No  . Sexual activity: Not on file  Other Topics Concern  . Not on file  Social History Narrative  . Not on file   Social Determinants of Health   Financial Resource Strain: Not on file  Food Insecurity: Not on file  Transportation Needs: Not on file  Physical Activity: Not on file  Stress: Not on file  Social Connections: Not on file  Intimate Partner Violence: Not on file      Review of Systems  Constitutional: Positive for fatigue. Negative for activity change, chills and unexpected weight change.  HENT: Negative for congestion, postnasal drip, rhinorrhea, sneezing and sore throat.   Respiratory: Negative for cough, chest tightness, shortness of breath and wheezing.   Cardiovascular: Negative for chest pain and palpitations.  Gastrointestinal: Negative for abdominal pain, constipation, diarrhea, nausea and vomiting.  Endocrine: Negative for cold intolerance, heat intolerance, polydipsia and polyuria.  Musculoskeletal:  Positive for back pain and myalgias. Negative for arthralgias, joint swelling and neck pain.       Does have mild degree of  Levoscoliosis. Hurts in rib cage area after exertion.   Skin: Negative for rash.  Allergic/Immunologic: Positive for environmental allergies.  Neurological: Negative for dizziness, tremors, numbness and headaches.  Hematological: Negative for adenopathy. Does not bruise/bleed easily.  Psychiatric/Behavioral: Positive for dysphoric mood. Negative for behavioral problems (Depression), sleep disturbance and  suicidal ideas. The patient is nervous/anxious.        Had to cancel her initial appointment with behavioral health because if interference with new job. Would like to have new referral.    Today's Vitals   10/05/20 0836  BP: (!) 142/88  Pulse: 99  Resp: 16  Temp: 97.6 F (36.4 C)  SpO2: 98%  Weight: 209 lb 3.2 oz (94.9 kg)  Height: 5\' 7"  (1.702 m)   Body mass index is 32.77 kg/m.  Physical Exam Vitals and nursing note reviewed.  Constitutional:      General: She is not in acute distress.    Appearance: Normal appearance. She is well-developed and well-nourished. She is not diaphoretic.  HENT:     Head: Normocephalic and atraumatic.     Mouth/Throat:     Mouth: Oropharynx is clear and moist.     Pharynx: No oropharyngeal exudate.  Eyes:     Extraocular Movements: EOM normal.     Pupils: Pupils are equal, round, and reactive to light.  Neck:     Thyroid: No thyromegaly.     Vascular: No JVD.     Trachea: No tracheal deviation.  Cardiovascular:     Rate and Rhythm: Normal rate and regular rhythm.     Heart sounds: Normal heart sounds. No murmur heard. No friction rub. No gallop.   Pulmonary:     Effort: Pulmonary effort is normal. No respiratory distress.     Breath sounds: Normal breath sounds. No wheezing or rales.  Chest:     Chest wall: No tenderness.  Abdominal:     Palpations: Abdomen is soft.  Musculoskeletal:        General: Normal range of motion.     Cervical back: Normal range of motion and neck supple.  Lymphadenopathy:     Cervical: No cervical adenopathy.  Skin:    General: Skin is warm and dry.  Neurological:     Mental Status: She is alert and oriented to person, place, and time.     Cranial Nerves: No cranial nerve deficit.  Psychiatric:        Attention and Perception: Attention and perception normal.        Mood and Affect: Mood is anxious.        Speech: Speech normal.        Behavior: Behavior normal. Behavior is cooperative.         Thought Content: Thought content normal.        Cognition and Memory: Cognition and memory normal.        Judgment: Judgment normal.   Assessment/Plan: 1. Elevated blood pressure reading Improved during visit. Will monitor.   2. 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 New referral to psychiatry made today.  - Ambulatory referral to Psychiatry  3. Other form of scoliosis of thoracolumbar spine Has seen orthopedics who was not concerned about degree of abnormality. Recommended chiropractor services for further evaluation  and treatment.   General Counseling: bethanee redondo understanding of the findings of todays visit and agrees with plan of treatment. I have discussed any further diagnostic evaluation that may be needed or ordered today. We also reviewed her medications today. she has been encouraged to call the office with any questions or concerns that should arise related to todays visit.    Orders Placed This Encounter  Procedures  . Ambulatory referral to Psychiatry   This patient was seen by Vincent Gros FNP Collaboration with Dr Lyndon Code as a part of collaborative care agreement  Total time spent: 30 Minutes   Time spent includes review of chart, medications, test results, and follow up plan with the patient.      Dr Lyndon Code Internal medicine

## 2020-10-23 ENCOUNTER — Other Ambulatory Visit: Payer: Self-pay

## 2020-10-23 DIAGNOSIS — N921 Excessive and frequent menstruation with irregular cycle: Secondary | ICD-10-CM

## 2020-10-23 MED ORDER — NORGESTIM-ETH ESTRAD TRIPHASIC 0.18/0.215/0.25 MG-25 MCG PO TABS: 1.0000 | ORAL_TABLET | Freq: Every day | ORAL | 1 refills | Status: DC

## 2020-11-16 ENCOUNTER — Other Ambulatory Visit: Payer: Self-pay

## 2020-11-21 ENCOUNTER — Other Ambulatory Visit: Payer: Self-pay

## 2020-11-21 DIAGNOSIS — N921 Excessive and frequent menstruation with irregular cycle: Secondary | ICD-10-CM

## 2020-11-22 IMAGING — CR DG SCOLIOSIS EVAL COMPLETE SPINE 2-3V
1 series · 2 of 2 positions shown · non-contrast
Comparison: None.

CLINICAL DATA: Reported family history of rib defects

EXAM:
DG SCOLIOSIS EVAL COMPLETE SPINE 2-3V

[Series 1: dg scoliosis eval complete spine 2 or 3  · 0.14mm/px · 2 of 2 slices shown]
[im 1/2]
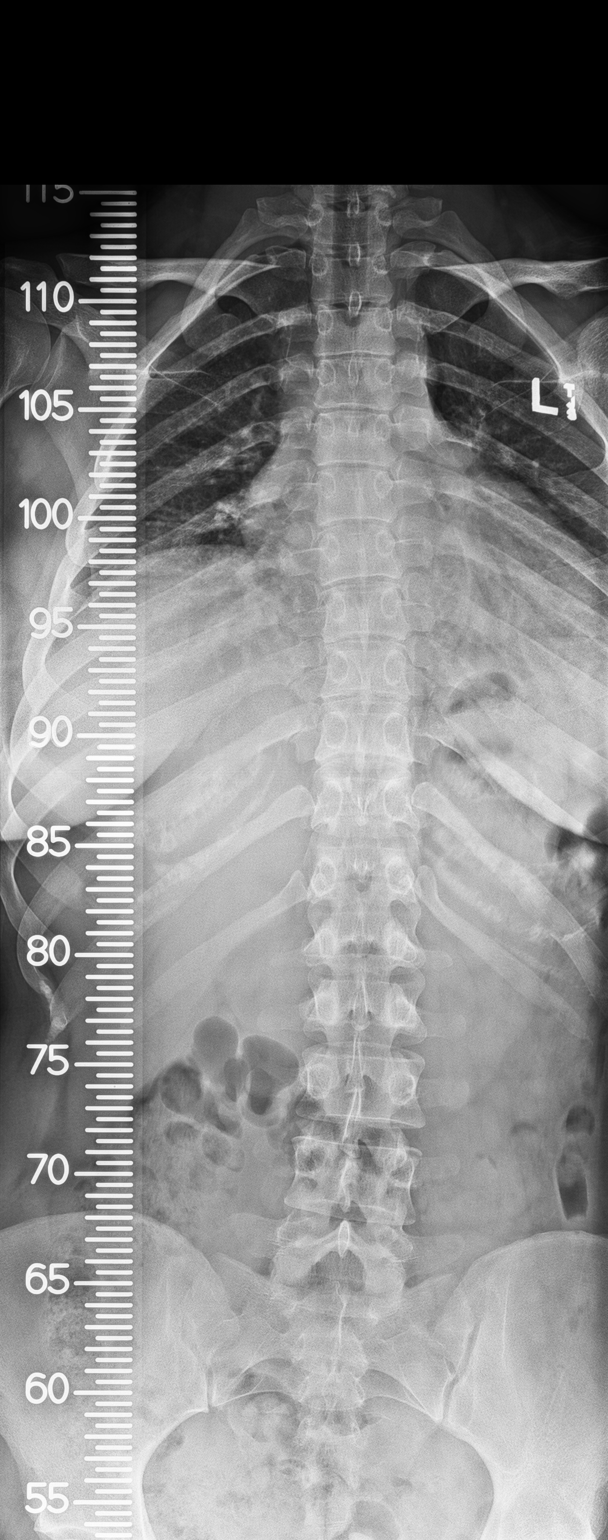
[im 2/2]
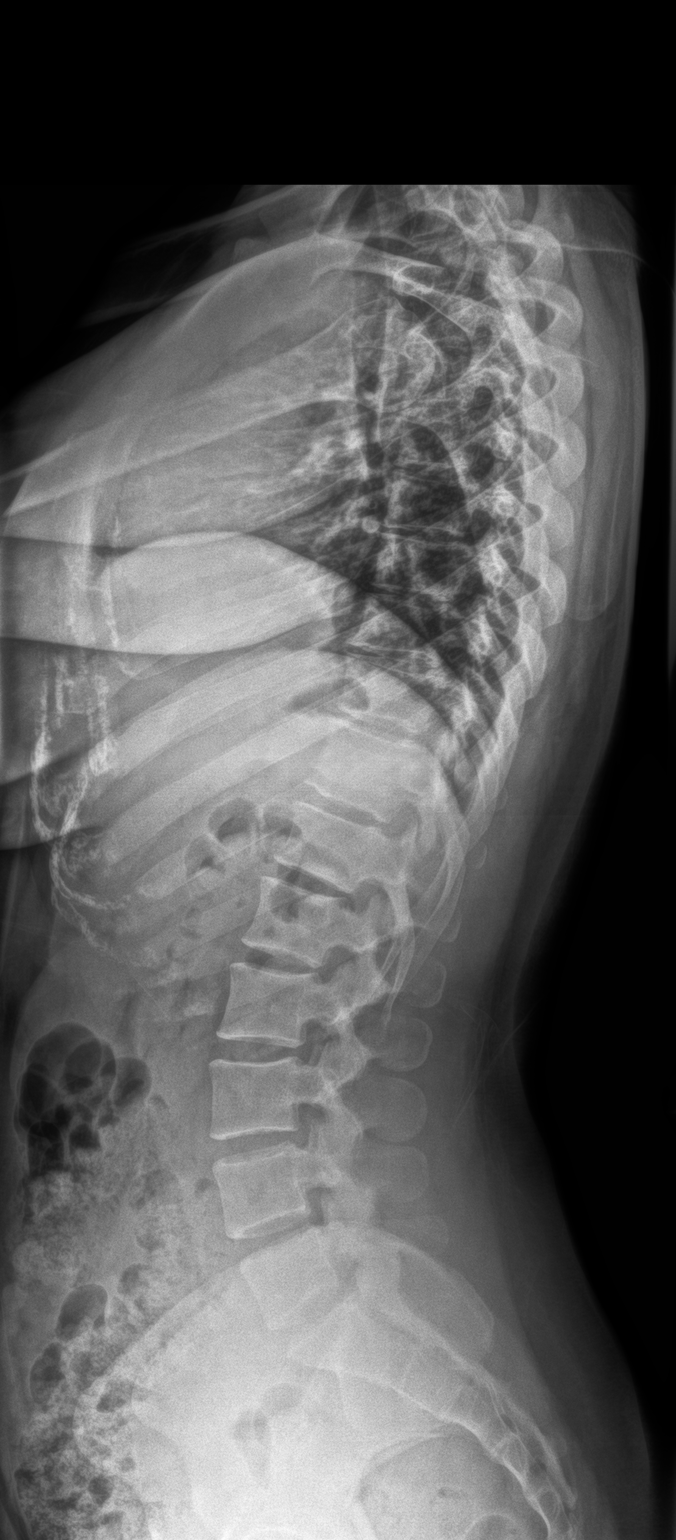

[2 of 2 positions shown; findings below may reference images not displayed]

FINDINGS: Frontal and lateral views of the lower cervical, thoracic, lumbar,
and sacral regions obtained. There is mild levoscoliosis involving
the mid to lower thoracic and lumbar spine regions. Cobbs angle
measurement from the superior aspect of T7 to the inferior aspect of
L4 is 13 degrees. Vertebral bodies at all levels appear normally
formed. No bony abnormalities are evident on this study. Visualized
lungs clear.
IMPRESSION: There is a degree of thoracic and lumbar levoscoliosis with a Cobbs
angle measurement from the superior aspect of T7 to the inferior
aspect of L4 of 13 degrees. Vertebral bodies normally formed at all
levels.

## 2021-01-05 ENCOUNTER — Other Ambulatory Visit: Payer: Self-pay | Admitting: Nurse Practitioner

## 2021-01-05 DIAGNOSIS — N921 Excessive and frequent menstruation with irregular cycle: Secondary | ICD-10-CM

## 2021-03-15 ENCOUNTER — Encounter: Payer: Self-pay | Admitting: Nurse Practitioner

## 2021-03-15 ENCOUNTER — Ambulatory Visit: Payer: Medicaid Other | Admitting: Nurse Practitioner

## 2021-03-15 ENCOUNTER — Telehealth: Payer: Self-pay

## 2021-03-15 DIAGNOSIS — Z1152 Encounter for screening for COVID-19: Secondary | ICD-10-CM

## 2021-03-15 DIAGNOSIS — N921 Excessive and frequent menstruation with irregular cycle: Secondary | ICD-10-CM

## 2021-03-15 DIAGNOSIS — J0111 Acute recurrent frontal sinusitis: Secondary | ICD-10-CM

## 2021-03-15 DIAGNOSIS — H60503 Unspecified acute noninfective otitis externa, bilateral: Secondary | ICD-10-CM

## 2021-03-15 DIAGNOSIS — Z3041 Encounter for surveillance of contraceptive pills: Secondary | ICD-10-CM

## 2021-03-15 DIAGNOSIS — J321 Chronic frontal sinusitis: Secondary | ICD-10-CM

## 2021-03-15 LAB — POC SOFIA 2 FLU + SARS ANTIGEN FIA: SARS Coronavirus 2 Ag: NEGATIVE

## 2021-03-15 MED ORDER — NORGESTIM-ETH ESTRAD TRIPHASIC 0.18/0.215/0.25 MG-25 MCG PO TABS: 1.0000 | ORAL_TABLET | Freq: Every day | ORAL | 5 refills | Status: DC

## 2021-03-15 MED ORDER — OFLOXACIN 0.3 % OT SOLN: 5.0000 [drp] | Freq: Every day | OTIC | 0 refills | Status: AC

## 2021-03-15 MED ORDER — CIPROFLOXACIN-DEXAMETHASONE 0.3-0.1 % OT SUSP: 4.0000 [drp] | Freq: Two times a day (BID) | OTIC | 0 refills | Status: DC

## 2021-03-15 MED ORDER — AMOXICILLIN-POT CLAVULANATE 875-125 MG PO TABS: 1.0000 | ORAL_TABLET | Freq: Two times a day (BID) | ORAL | 0 refills | Status: DC

## 2021-03-15 NOTE — Progress Notes (Signed)
[facility] [address] [place], [place] [address]  Internal MEDICINE  Office Visit Note  Patient Name: Claudia Melendez  [id number]  [id number]  Date of Service: 03/18/2021  Chief Complaint  Patient presents with  . Acute Visit    Dry cough sore throat, refill on med     HPI Pt is here for a sick visit. She tested negative for COVID. Symptoms started 4 days ago on Sunday morning with sore throat, dry cough, nasal congestion, painful swallowing, had tonsillectomy as a child, may have grown back per allergist.   Denies fever, fatigue, chills, and body aches   Current Medication:  Outpatient Encounter Medications as of 03/15/2021  Medication Sig  . amoxicillin-clavulanate (AUGMENTIN) 875-125 MG tablet Take 1 tablet by mouth 2 (two) times daily.  Marland Kitchen ibuprofen (ADVIL,MOTRIN) 200 MG tablet Take 800 mg by mouth every 6 (six) hours as needed.  . pseudoephedrine (SUDAFED) 30 MG tablet Take 30 mg by mouth every 4 (four) hours as needed for congestion.  . [DISCONTINUED] ciprofloxacin-dexamethasone (CIPRODEX) OTIC suspension Place 4 drops into both ears 2 (two) times daily for 7 days.  . [DISCONTINUED] TRI-LO-MARZIA 0.18/0.215/0.25 MG-25 MCG tab TAKE 1 TABLET BY MOUTH EVERY DAY  . Calcium Carbonate-Vit D-Min (CALCIUM 1200 PO) Take by mouth. (Patient not taking: Reported on 03/15/2021)  . Cholecalciferol (VITAMIN D3) 1000 units CAPS Take by mouth. (Patient not taking: Reported on 03/15/2021)  . Cyanocobalamin (B-12) 1000 MCG CAPS Take by mouth. (Patient not taking: Reported on 03/15/2021)  . Multiple Vitamin (MULTIVITAMIN) tablet Take 1 tablet by mouth daily. (Patient not taking: Reported on 03/15/2021)  . [DISCONTINUED] Norgestimate-Ethinyl Estradiol Triphasic (TRI-LO-MARZIA) 0.18/0.215/0.25 MG-25 MCG tab Take 1 tablet by mouth daily.   No facility-administered encounter medications on file as of 03/15/2021.      Medical History: Past Medical History:  Diagnosis Date  .  Allergies   . B12 deficiency   . Iron (Fe) deficiency anemia   . Migraines   . Vitamin D deficiency      Vital Signs: BP 140/86   Pulse 100   Temp 98.9 F (37.2 C)   Resp 16   Ht 5\' 7"  (1.702 m)   Wt 221 lb 6.4 oz (100.4 kg)   SpO2 97%   BMI 34.68 kg/m    Review of Systems  Constitutional: Positive for chills, fatigue and fever (on monday night, not since). Negative for appetite change.  HENT: Positive for congestion, ear pain, postnasal drip, sinus pressure, sinus pain, sneezing, sore throat and trouble swallowing. Negative for rhinorrhea.   Eyes: Positive for pain, redness and itching.  Respiratory: Positive for cough, chest tightness and wheezing. Negative for shortness of breath.   Cardiovascular: Negative for chest pain.  Gastrointestinal: Negative for abdominal pain, constipation, diarrhea, nausea and vomiting.  Allergic/Immunologic: Positive for environmental allergies.  Neurological: Positive for headaches. Negative for dizziness and light-headedness.    Physical Exam HENT:     Head: Normocephalic and atraumatic.     Right Ear: Decreased hearing noted. Swelling and tenderness present. Tympanic membrane is injected.     Left Ear: Decreased hearing noted. Swelling and tenderness present. Tympanic membrane is injected.     Nose: Congestion present. No rhinorrhea.     Mouth/Throat:     Mouth: Mucous membranes are dry.     Pharynx: Posterior oropharyngeal erythema present. No oropharyngeal exudate.       Assessment/Plan:  1. Acute recurrent frontal sinusitis Patient has history os sinus infections. augmentin prescribed. -  amoxicillin-clavulanate (AUGMENTIN) 875-125 MG tablet; Take 1 tablet by mouth 2 (two) times daily.  Dispense: 20 tablet; Refill: 0  2. Acute otitis externa of both ears, unspecified type Ofloxacin ear drops ordered 5 drops to both ears daily.   3. Excessive and frequent menstruation with irregular cycle Managed with oral contraceptive  pills. Refill ordered.  4. Encounter for screening for COVID-19 Negative for COVID.  - POC SOFIA 2 FLU + SARS ANTIGEN FIA  5. Encounter for surveillance of contraceptive pills Patient in need of refill prescription for oral contraception. Refill ordered.    General Counseling: tersea aulds understanding of the findings of todays visit and agrees with plan of treatment. I have discussed any further diagnostic evaluation that may be needed or ordered today. We also reviewed her medications today. she has been encouraged to call the office with any questions or concerns that should arise related to todays visit.    Counseling:    Orders Placed This Encounter  Procedures  . POC SOFIA 2 FLU + SARS ANTIGEN FIA    Meds ordered this encounter  Medications  . amoxicillin-clavulanate (AUGMENTIN) 875-125 MG tablet    Sig: Take 1 tablet by mouth 2 (two) times daily.    Dispense:  20 tablet    Refill:  0  . DISCONTD: ciprofloxacin-dexamethasone (CIPRODEX) OTIC suspension    Sig: Place 4 drops into both ears 2 (two) times daily for 7 days.    Dispense:  2.8 mL    Refill:  0  . DISCONTD: Norgestimate-Ethinyl Estradiol Triphasic (TRI-LO-MARZIA) 0.18/0.215/0.25 MG-25 MCG tab    Sig: Take 1 tablet by mouth daily.    Dispense:  28 tablet    Refill:  5    Please send further refill requests to [facility]. Thanks   Return if symptoms worsen or fail to improve.   [place] Controlled Substance Database was reviewed by me.  Time spent:30 Minutes  This patient was seen by Sallyanne Kuster, FNP-C in collaboration with Dr. Beverely Risen as a part of collaborative care agreement.

## 2021-03-15 NOTE — Telephone Encounter (Signed)
Resent meds under Dr. Milta Deiters name so it will be covered by insurance, pt aware

## 2021-03-19 NOTE — Addendum Note (Signed)
Addended by: Lyndon Code on: 03/19/2021 02:22 PM   Modules accepted: Level of Service

## 2021-03-30 ENCOUNTER — Telehealth: Payer: Self-pay

## 2021-03-30 ENCOUNTER — Other Ambulatory Visit: Payer: Self-pay

## 2021-03-30 MED ORDER — AZITHROMYCIN 250 MG PO TABS: ORAL_TABLET | ORAL | 0 refills | Status: DC

## 2021-03-30 NOTE — Telephone Encounter (Signed)
As per Claudia Melendez  Send zpak and advised to take OTC cough syrup mucinex or delsym and gargle with salt water for scratchy throat and if not feeling better need to been seen

## 2021-04-03 ENCOUNTER — Encounter: Payer: Self-pay | Admitting: Nurse Practitioner

## 2021-04-03 ENCOUNTER — Ambulatory Visit: Payer: Medicaid Other | Admitting: Nurse Practitioner

## 2021-04-03 ENCOUNTER — Other Ambulatory Visit: Payer: Self-pay

## 2021-04-03 VITALS — BP 108/82 | HR 87 | Temp 98.2°F | Resp 16 | Ht 67.0 in | Wt 223.8 lb

## 2021-04-03 DIAGNOSIS — J019 Acute sinusitis, unspecified: Secondary | ICD-10-CM

## 2021-04-03 DIAGNOSIS — L03031 Cellulitis of right toe: Secondary | ICD-10-CM | POA: Diagnosis not present

## 2021-04-03 MED ORDER — MUPIROCIN 2 % EX OINT: 1.0000 "application " | TOPICAL_OINTMENT | Freq: Two times a day (BID) | CUTANEOUS | 0 refills | Status: DC

## 2021-04-03 MED ORDER — DOXYCYCLINE HYCLATE 100 MG PO TABS: 100.0000 mg | ORAL_TABLET | Freq: Two times a day (BID) | ORAL | 0 refills | Status: DC

## 2021-04-03 NOTE — Progress Notes (Signed)
Claudia Melendez [address] [place], [place] [address]  Internal MEDICINE  Office Visit Note  Patient Name: Claudia Melendez  [id number]  [id number]  Date of Service: 04/04/2021  Chief Complaint  Patient presents with  . Acute Visit    Given zpack, still feels bad, sore throat, ears still feel like patient is under water, ingrown toe nail is now infected rt foot big toe,    HPI Claudia Melendez presents for a acute sick visit for a persistent sinus infection and an ingrown toenail that has become infected.  At her previous office visit she was prescribed Augmentin and moxifloxacin drops for bilateral ear infections.  After finishing the Augmentin, her grandmother called the clinic to inform that the infection is still there and if still bothering the patient so a Z-Pak was prescribed.  The patient still has 1 more day for the Z-Pak.  It would not be recommended to try another antibiotic especially since she has not finished the Z-Pak.  She is still experiencing sore throat muffled hearing with ear fullness congestion.  She has done 2 home COVID tests that were both negative.   She reports that she has an ingrown toenail on her right great toe.  It is swollen and tender.  It is difficult to wear closed toed shoes other than crocs.  She reports a and red tender inflamed area coming out from beside the nail and some opaque yellow drainage.   Take xyzal 5 mg at night.   right great toe  cellulitis   Current Medication: Outpatient Encounter Medications as of 04/03/2021  Medication Sig  . azithromycin (ZITHROMAX) 250 MG tablet Use as directed for  5 days  . doxycycline (VIBRA-TABS) 100 MG tablet Take 1 tablet (100 mg total) by mouth 2 (two) times daily. With food  . mupirocin ointment (BACTROBAN) 2 % Apply 1 application topically 2 (two) times daily for 7 days. To the affected area.  . Norgestimate-Ethinyl Estradiol Triphasic (TRI-LO-MARZIA) 0.18/0.215/0.25 MG-25 MCG tab Take 1 tablet by mouth  daily.  . pseudoephedrine (SUDAFED) 30 MG tablet Take 30 mg by mouth every 4 (four) hours as needed for congestion.  . Calcium Carbonate-Vit D-Min (CALCIUM 1200 PO) Take by mouth. (Patient not taking: Reported on 04/03/2021)  . Cholecalciferol (VITAMIN D3) 1000 units CAPS Take by mouth. (Patient not taking: Reported on 04/03/2021)  . Cyanocobalamin (B-12) 1000 MCG CAPS Take by mouth. (Patient not taking: Reported on 04/03/2021)  . ibuprofen (ADVIL,MOTRIN) 200 MG tablet Take 800 mg by mouth every 6 (six) hours as needed. (Patient not taking: Reported on 04/03/2021)  . Multiple Vitamin (MULTIVITAMIN) tablet Take 1 tablet by mouth daily. (Patient not taking: Reported on 04/03/2021)   No facility-administered encounter medications on file as of 04/03/2021.    Surgical History: Past Surgical History:  Procedure Laterality Date  . corrective eye surgery    . toncillectomy      Medical History: Past Medical History:  Diagnosis Date  . Allergies   . B12 deficiency   . Iron (Fe) deficiency anemia   . Migraines   . Vitamin D deficiency     Family History: Family History  Problem Relation Age of Onset  . Cancer Father        mouth  . Colon cancer Paternal Grandmother     Social History   Socioeconomic History  . Marital status: Single    Spouse name: Not on file  . Number of children: Not on file  . Years of  education: Not on file  . Highest education level: Not on file  Occupational History  . Not on file  Tobacco Use  . Smoking status: Never Smoker  . Smokeless tobacco: Never Used  Vaping Use  . Vaping Use: Never used  Substance and Sexual Activity  . Alcohol use: No  . Drug use: No  . Sexual activity: Not on file  Other Topics Concern  . Not on file  Social History Narrative  . Not on file   Social Determinants of Health   Financial Resource Strain: Not on file  Food Insecurity: Not on file  Transportation Needs: Not on file  Physical Activity: Not on file  Stress: Not  on file  Social Connections: Not on file  Intimate Partner Violence: Not on file      Review of Systems  Constitutional: Positive for fatigue. Negative for chills and fever.  HENT: Positive for congestion, postnasal drip, rhinorrhea, sinus pressure, sinus pain and sore throat.   Eyes: Negative.   Respiratory: Positive for cough. Negative for chest tightness, shortness of breath and wheezing.   Cardiovascular: Negative.  Negative for chest pain.  Gastrointestinal: Negative.  Negative for abdominal pain, constipation, diarrhea, nausea and vomiting.  Genitourinary: Negative.   Musculoskeletal: Negative.   Skin: Positive for wound (right great toe ingrown nail). Negative for rash.  Psychiatric/Behavioral: Negative.     Vital Signs: BP 108/82   Pulse 87   Temp 98.2 F (36.8 C)   Resp 16   Ht 5\' 7"  (1.702 m)   Wt 223 lb 12.8 oz (101.5 kg)   SpO2 96%   BMI 35.05 kg/m    Physical Exam Constitutional:      General: She is not in acute distress.    Appearance: She is obese. She is ill-appearing.  HENT:     Head: Normocephalic and atraumatic.     Right Ear: Tympanic membrane, ear canal and external ear normal.     Left Ear: Tympanic membrane, ear canal and external ear normal.     Nose: Congestion and rhinorrhea present.     Mouth/Throat:     Mouth: Mucous membranes are moist.     Pharynx: Posterior oropharyngeal erythema present.  Eyes:     Extraocular Movements: Extraocular movements intact.     Conjunctiva/sclera: Conjunctivae normal.     Pupils: Pupils are equal, round, and reactive to light.  Cardiovascular:     Rate and Rhythm: Normal rate and regular rhythm.     Pulses: Normal pulses.     Heart sounds: Normal heart sounds.  Pulmonary:     Effort: Pulmonary effort is normal. No respiratory distress.     Breath sounds: Normal breath sounds. No wheezing.  Musculoskeletal:     Cervical back: Normal range of motion and neck supple.  Feet:     Right foot:     Skin  integrity: Erythema (right great toe) present.     Toenail Condition: Right toenails are ingrown.     Left foot:     Skin integrity: Skin integrity normal.     Toenail Condition: Left toenails are normal.  Lymphadenopathy:     Cervical: Cervical adenopathy present.  Skin:    General: Skin is warm and dry.     Capillary Refill: Capillary refill takes less than 2 seconds.     Findings: Erythema and wound present.     Comments: Right great toenail is ingrown on the lateral side, it is erythematous, swollen, tender and warm to  touch. There is yellow serous and purulent drainage at the site, the wound bed is beefy red. The erythema is localized, there is no spreading of swelling and erythema beyond the area of the ingrown nail.   Neurological:     Mental Status: She is alert and oriented to person, place, and time.  Psychiatric:        Mood and Affect: Mood normal.        Behavior: Behavior normal.    Assessment/Plan: 1. Subacute sinusitis, unspecified location Symptoms of sinusitis have not yet resolved. She has completed a course of augmentin and still has 1 mote day of a Z-Pack. Patient is self pay and cost is a concern. A third course of antibiotics is not recommended. Symptomatic treatment with levocetirizine OTC 5 mg at bedtime and chlorpheniramine 4 mg every 4 hours as needed if symptoms persist was recommended. Cost of both OTC medications was discussed during the visit. levocetirizine is approx $12 per 35 tablets and chlorpheniramine is less than $5 for 100 tablets.   2. Cellulitis of toe of right foot Cellulitis infection of right great toe, doxy and mupirocin ointment prescribed for 7 days.  - doxycycline (VIBRA-TABS) 100 MG tablet; Take 1 tablet (100 mg total) by mouth 2 (two) times daily. With food  Dispense: 14 tablet; Refill: 0 - mupirocin ointment (BACTROBAN) 2 %; Apply 1 application topically 2 (two) times daily for 7 days. To the affected area.  Dispense: 22 g; Refill:  0   General Counseling: Dondra verbalizes understanding of the findings of todays visit and agrees with plan of treatment. I have discussed any further diagnostic evaluation that may be needed or ordered today. We also reviewed her medications today. she has been encouraged to call the office with any questions or concerns that should arise related to todays visit.    No orders of the defined types were placed in this encounter.   Meds ordered this encounter  Medications  . doxycycline (VIBRA-TABS) 100 MG tablet    Sig: Take 1 tablet (100 mg total) by mouth 2 (two) times daily. With food    Dispense:  14 tablet    Refill:  0  . mupirocin ointment (BACTROBAN) 2 %    Sig: Apply 1 application topically 2 (two) times daily for 7 days. To the affected area.    Dispense:  22 g    Refill:  0    Return in about 2 weeks (around 04/17/2021) for F/U med check, sinusitis , Arav Bannister PCP. please call and cancel appointment if symptoms are resolving and follow up is not needed. Patient is self-pay.    Total time spent:30 Minutes Time spent includes review of chart, medications, test results, and follow up plan with the patient.   [place] Controlled Substance Database was reviewed by me.  This patient was seen by Sallyanne Kuster, FNP-C in collaboration with Dr. Beverely Risen as a part of collaborative care agreement.   Amayra Kiedrowski R. Tedd Sias, MSN, FNP-C Internal medicine

## 2021-04-06 ENCOUNTER — Other Ambulatory Visit: Payer: Medicaid Other | Admitting: Nurse Practitioner

## 2021-04-17 ENCOUNTER — Ambulatory Visit: Payer: Medicaid Other | Admitting: Nurse Practitioner

## 2021-05-08 ENCOUNTER — Other Ambulatory Visit: Payer: Medicaid Other | Admitting: Nurse Practitioner

## 2021-05-10 ENCOUNTER — Telehealth: Payer: Medicaid Other | Admitting: Physician Assistant

## 2021-05-10 ENCOUNTER — Encounter: Payer: Self-pay | Admitting: Physician Assistant

## 2021-05-10 VITALS — Ht 67.0 in | Wt 223.0 lb

## 2021-05-10 DIAGNOSIS — J0111 Acute recurrent frontal sinusitis: Secondary | ICD-10-CM

## 2021-05-10 NOTE — Progress Notes (Signed)
[facility] [address] [place], [place] [address]  Internal MEDICINE  Telephone Visit  Patient Name: Claudia Melendez  [id number]  [id number]  Date of Service: 05/14/2021  I connected with the patient at 4:15 by telephone and verified the patients identity using two identifiers.   I discussed the limitations, risks, security and privacy concerns of performing an evaluation and management service by telephone and the availability of in person appointments. I also discussed with the patient that there may be a patient responsible charge related to the service.  The patient expressed understanding and agrees to proceed.    Chief Complaint  Patient presents with   Sinusitis    Congestion, running nose, stuffed up, started Tuesday night, light couch, ear hurts just got over a double ear infection, SOB when going up a flight of stairs,     Telephone Assessment    [phone] telephone call     Telephone Screen   Quality Metric Gaps    T-dap vaccine, HPV vaccine      HPI Pt is here for virtual sick visit -Took amoxicillin and zpak last two visits. Had some amoxicillin left over and took 2 tabs the past two days and took psuedophed and excedrin. Used Afrin this morning and knows not to use more than 3 days. Used flonase in past and needed to stop due to nasal tissue break down and then nose bleeds. Uses ayr spray now instead. -Tuesday started back up and last night much worse. Ear started hurting with pressure again. Has ear drops from last time -Has not taken covid test, going to take one this afternoon and report back to office tomorrow with results and how she is feeling. -Will start mucinex  Current Medication: Outpatient Encounter Medications as of 05/10/2021  Medication Sig   Norgestimate-Ethinyl Estradiol Triphasic (TRI-LO-MARZIA) 0.18/0.215/0.25 MG-25 MCG tab Take 1 tablet by mouth daily.   pseudoephedrine (SUDAFED) 30 MG tablet Take 30 mg by mouth every 4 (four)  hours as needed for congestion.   Tdap (ADACEL) 02-26-14.5 LF-MCG/0.5 injection Inject 0.5 mLs into the muscle once.   [DISCONTINUED] azithromycin (ZITHROMAX) 250 MG tablet Use as directed for  5 days   [DISCONTINUED] doxycycline (VIBRA-TABS) 100 MG tablet Take 1 tablet (100 mg total) by mouth 2 (two) times daily. With food   mupirocin ointment (BACTROBAN) 2 % Apply 1 application topically 2 (two) times daily for 7 days. To the affected area.   No facility-administered encounter medications on file as of 05/10/2021.    Surgical History: Past Surgical History:  Procedure Laterality Date   corrective eye surgery     toncillectomy      Medical History: Past Medical History:  Diagnosis Date   Allergies    B12 deficiency    Iron (Fe) deficiency anemia    Migraines    Vitamin D deficiency     Family History: Family History  Problem Relation Age of Onset   Cancer Father        mouth   Colon cancer Paternal Grandmother     Social History   Socioeconomic History   Marital status: Single    Spouse name: Not on file   Number of children: Not on file   Years of education: Not on file   Highest education level: Not on file  Occupational History   Not on file  Tobacco Use   Smoking status: Never   Smokeless tobacco: Never  Vaping Use   Vaping Use: Never used  Substance and Sexual Activity   Alcohol use: No   Drug use: No   Sexual activity: Not on file  Other Topics Concern   Not on file  Social History Narrative   Not on file   Social Determinants of Health   Financial Resource Strain: Not on file  Food Insecurity: Not on file  Transportation Needs: Not on file  Physical Activity: Not on file  Stress: Not on file  Social Connections: Not on file  Intimate Partner Violence: Not on file      Review of Systems  Constitutional:  Negative for fatigue and fever.  HENT:  Positive for congestion, ear pain and postnasal drip. Negative for mouth sores.   Respiratory:   Positive for cough and shortness of breath. Negative for wheezing.   Cardiovascular:  Negative for chest pain.  Genitourinary:  Negative for flank pain.  Psychiatric/Behavioral: Negative.     Vital Signs: Ht 5\' 7"  (1.702 m)   Wt 223 lb (101.2 kg)   BMI 34.93 kg/m    Observation/Objective:  Pt able to carry out conversation   Assessment/Plan: 1. Acute recurrent frontal sinusitis Recommend she start mucinex and may continue tylenol as needed. Pt had negative covid test and worsening congestion--will start augmentin and will try sinus rinses. Discussed not using afrin more than 3 days due to rebound congestion and will refer to ENT due to recurrent sinus and ear infection requiring multiple rounds of antibiotics.   General Counseling: jovonna nickell understanding of the findings of today's phone visit and agrees with plan of treatment. I have discussed any further diagnostic evaluation that may be needed or ordered today. We also reviewed her medications today. she has been encouraged to call the office with any questions or concerns that should arise related to todays visit.    No orders of the defined types were placed in this encounter.   No orders of the defined types were placed in this encounter.   Time spent:30 Minutes    Dr Dellis Filbert Internal medicine

## 2021-05-11 ENCOUNTER — Other Ambulatory Visit: Payer: Self-pay | Admitting: Physician Assistant

## 2021-05-11 ENCOUNTER — Telehealth: Payer: Self-pay

## 2021-05-11 DIAGNOSIS — J0111 Acute recurrent frontal sinusitis: Secondary | ICD-10-CM

## 2021-05-11 MED ORDER — AMOXICILLIN-POT CLAVULANATE 875-125 MG PO TABS: 1.0000 | ORAL_TABLET | Freq: Two times a day (BID) | ORAL | 0 refills | Status: DC

## 2021-05-11 NOTE — Telephone Encounter (Signed)
Pt send Augmentin to [facility]ar also advised she can use ear drops she had given before and we placed referral for ENT

## 2021-05-15 ENCOUNTER — Telehealth: Payer: Self-pay

## 2021-05-15 NOTE — Telephone Encounter (Signed)
Referral faxed to [facility] ENT. Toni Amend

## 2021-05-21 ENCOUNTER — Telehealth: Payer: Self-pay

## 2021-05-21 NOTE — Telephone Encounter (Signed)
Pt grandma called pt was crying and panic attack advised her go to urgent care otherwise we gave her Thursday or call back for tomorrow if we have opening pt only like to seed dr Welton Flakes we gave her next available appt

## 2021-05-24 ENCOUNTER — Ambulatory Visit: Payer: Medicaid Other | Admitting: Internal Medicine

## 2021-05-25 ENCOUNTER — Telehealth: Payer: Self-pay

## 2021-05-25 NOTE — Telephone Encounter (Signed)
Left vm for patient to return call regarding referral-Toni

## 2021-06-04 ENCOUNTER — Encounter: Payer: Self-pay | Admitting: Nurse Practitioner

## 2021-06-04 ENCOUNTER — Other Ambulatory Visit: Payer: Self-pay

## 2021-06-04 ENCOUNTER — Ambulatory Visit: Payer: Medicaid Other | Admitting: Nurse Practitioner

## 2021-06-04 VITALS — BP 140/78 | HR 87 | Temp 97.6°F | Resp 16 | Ht 67.0 in | Wt 219.8 lb

## 2021-06-04 DIAGNOSIS — R0602 Shortness of breath: Secondary | ICD-10-CM | POA: Diagnosis not present

## 2021-06-04 DIAGNOSIS — F418 Other specified anxiety disorders: Secondary | ICD-10-CM

## 2021-06-04 MED ORDER — BUSPIRONE HCL 10 MG PO TABS: 10.0000 mg | ORAL_TABLET | Freq: Two times a day (BID) | ORAL | 0 refills | Status: DC
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
## 2021-06-18 ENCOUNTER — Other Ambulatory Visit: Payer: Self-pay

## 2021-06-18 ENCOUNTER — Ambulatory Visit: Payer: Self-pay | Admitting: Nurse Practitioner

## 2021-06-18 ENCOUNTER — Encounter: Payer: Self-pay | Admitting: Nurse Practitioner

## 2021-06-18 VITALS — BP 128/88 | HR 91 | Temp 97.8°F | Resp 16 | Ht 67.0 in | Wt 219.0 lb

## 2021-06-18 DIAGNOSIS — F418 Other specified anxiety disorders: Secondary | ICD-10-CM

## 2021-06-18 DIAGNOSIS — M4185 Other forms of scoliosis, thoracolumbar region: Secondary | ICD-10-CM

## 2021-06-18 DIAGNOSIS — Z0001 Encounter for general adult medical examination with abnormal findings: Secondary | ICD-10-CM

## 2021-06-18 NOTE — Progress Notes (Signed)
[facility] [address] [place], [place] [address]  Internal MEDICINE  Office Visit Note  Patient Name: Claudia Melendez  [id number]  [id number]  Date of Service: 06/18/2021  Chief Complaint  Patient presents with   Annual Exam   Anemia     Shermika presents for an annual well visit and physical exam. she has a history of anemia and anxiety. She has received 2 doses of the COVID vaccine and 1 booster dose. She had her pap done last year 2021 and it was normal, repeat in 2024. She is a nonsmoker, denies alcohol  or recreational drug use.  ---the buspirone is helping with her anxiety. She also put in her 2 week notice and is leaving her current job and she feels much better. She is looking into finding a job that she can use her degree for this time now that she has finished school.  She denies any pain. She denies any other concerns or questions.      Current Medication: Outpatient Encounter Medications as of 06/18/2021  Medication Sig   busPIRone (BUSPAR) 10 MG tablet Take 1 tablet (10 mg total) by mouth 2 (two) times daily.   Norgestimate-Ethinyl Estradiol Triphasic (TRI-LO-MARZIA) 0.18/0.215/0.25 MG-25 MCG tab Take 1 tablet by mouth daily.   pseudoephedrine (SUDAFED) 30 MG tablet Take 30 mg by mouth every 4 (four) hours as needed for congestion.   Tdap (ADACEL) 02-26-14.5 LF-MCG/0.5 injection Inject 0.5 mLs into the muscle once.   [DISCONTINUED] amoxicillin-clavulanate (AUGMENTIN) 875-125 MG tablet TAKE 1 TABLET BY MOUTH TWICE A DAY (Patient not taking: Reported on 06/04/2021)   [DISCONTINUED] mupirocin ointment (BACTROBAN) 2 % Apply 1 application topically 2 (two) times daily for 7 days. To the affected area.   No facility-administered encounter medications on file as of 06/18/2021.    Surgical History: Past Surgical History:  Procedure Laterality Date   corrective eye surgery     toncillectomy      Medical History: Past Medical History:  Diagnosis Date    Allergies    B12 deficiency    Iron (Fe) deficiency anemia    Migraines    Vitamin D deficiency     Family History: Family History  Problem Relation Age of Onset   Cancer Father        mouth   Colon cancer Paternal Grandmother     Social History   Socioeconomic History   Marital status: Single    Spouse name: Not on file   Number of children: Not on file   Years of education: Not on file   Highest education level: Not on file  Occupational History   Not on file  Tobacco Use   Smoking status: Never   Smokeless tobacco: Never  Vaping Use   Vaping Use: Never used  Substance and Sexual Activity   Alcohol use: No   Drug use: No   Sexual activity: Not on file  Other Topics Concern   Not on file  Social History Narrative   Not on file   Social Determinants of Health   Financial Resource Strain: Not on file  Food Insecurity: Not on file  Transportation Needs: Not on file  Physical Activity: Not on file  Stress: Not on file  Social Connections: Not on file  Intimate Partner Violence: Not on file      Review of Systems  Constitutional:  Negative for activity change, appetite change, chills, fatigue, fever and unexpected weight change.  HENT: Negative.  Negative for congestion,  ear pain, rhinorrhea, sore throat and trouble swallowing.   Eyes: Negative.   Respiratory: Negative.  Negative for cough, chest tightness, shortness of breath and wheezing.   Cardiovascular: Negative.  Negative for chest pain.  Gastrointestinal: Negative.  Negative for abdominal pain, blood in stool, constipation, diarrhea, nausea and vomiting.  Endocrine: Negative.   Genitourinary: Negative.  Negative for difficulty urinating, dysuria, frequency, hematuria and urgency.  Musculoskeletal: Negative.  Negative for arthralgias, back pain, joint swelling, myalgias and neck pain.  Skin: Negative.  Negative for rash and wound.  Allergic/Immunologic: Negative.  Negative for immunocompromised state.   Neurological: Negative.  Negative for dizziness, seizures, numbness and headaches.  Hematological: Negative.   Psychiatric/Behavioral: Negative.  Negative for behavioral problems, self-injury and suicidal ideas. The patient is not nervous/anxious.    Vital Signs: BP 128/88 (BP Location: Left Arm, Patient Position: Sitting, Cuff Size: Large)   Pulse 91   Temp 97.8 F (36.6 C)   Resp 16   Ht 5\' 7"  (1.702 m)   Wt 219 lb (99.3 kg)   SpO2 97%   BMI 34.30 kg/m    Physical Exam Vitals reviewed.  Constitutional:      General: She is not in acute distress.    Appearance: Normal appearance. She is well-developed. She is obese. She is not ill-appearing or diaphoretic.  HENT:     Head: Normocephalic and atraumatic.     Right Ear: Tympanic membrane, ear canal and external ear normal.     Left Ear: Tympanic membrane, ear canal and external ear normal.     Nose: Nose normal. No congestion or rhinorrhea.     Mouth/Throat:     Mouth: Mucous membranes are moist.     Pharynx: Oropharynx is clear. No oropharyngeal exudate or posterior oropharyngeal erythema.  Eyes:     General: No scleral icterus.       Right eye: No discharge.        Left eye: No discharge.     Extraocular Movements: Extraocular movements intact.     Conjunctiva/sclera: Conjunctivae normal.     Pupils: Pupils are equal, round, and reactive to light.  Neck:     Thyroid: No thyromegaly.     Vascular: No JVD.     Trachea: No tracheal deviation.  Cardiovascular:     Rate and Rhythm: Normal rate and regular rhythm.     Pulses: Normal pulses.     Heart sounds: Normal heart sounds. No murmur heard.   No friction rub. No gallop.  Pulmonary:     Effort: Pulmonary effort is normal. No respiratory distress.     Breath sounds: Normal breath sounds. No stridor. No wheezing or rales.  Chest:     Chest wall: No mass, lacerations, deformity, swelling or tenderness.  Breasts:    Right: Normal. No swelling, inverted nipple, mass,  nipple discharge, skin change or tenderness.     Left: Normal. No swelling, inverted nipple, mass, nipple discharge, skin change or tenderness.  Abdominal:     General: Bowel sounds are normal. There is no distension.     Palpations: Abdomen is soft. There is no mass.     Tenderness: There is no abdominal tenderness. There is no guarding or rebound.  Musculoskeletal:        General: No tenderness or deformity. Normal range of motion.     Cervical back: Normal range of motion and neck supple.  Lymphadenopathy:     Cervical: No cervical adenopathy.     Upper Body:  Right upper body: No supraclavicular, axillary or pectoral adenopathy.     Left upper body: No supraclavicular, axillary or pectoral adenopathy.  Skin:    General: Skin is warm and dry.     Capillary Refill: Capillary refill takes less than 2 seconds.     Coloration: Skin is not pale.     Findings: No erythema or rash.  Neurological:     Mental Status: She is alert and oriented to person, place, and time.     Cranial Nerves: No cranial nerve deficit.     Motor: No abnormal muscle tone.     Coordination: Coordination normal.     Deep Tendon Reflexes: Reflexes are normal and symmetric.  Psychiatric:        Mood and Affect: Mood normal.        Behavior: Behavior normal.        Thought Content: Thought content normal.        Judgment: Judgment normal.      Assessment/Plan: 1. Encounter for general adult medical examination with abnormal findings Age-appropriate preventive screenings and vaccinations discussed, annual physical exam completed. Routine labs for health maintenance not ordered, patient is currently self pay. Labs will be ordered once the patient has health insurance in place. She plans to apply during annual enrollment.Marland Kitchen PHM updated. Pap due in 2024.   2. Situational anxiety Improving, especially with plans in place to leave current job. Takes buspirone as needed for now, may not need it anymore once she is  no longer exposed to the setting that has been causing the anxiety.  3. Other form of scoliosis of thoracolumbar spine Scoliosis is stable and does not cause her any pain.     General Counseling: rhylen pulido understanding of the findings of todays visit and agrees with plan of treatment. I have discussed any further diagnostic evaluation that may be needed or ordered today. We also reviewed her medications today. she has been encouraged to call the office with any questions or concerns that should arise related to todays visit.    No orders of the defined types were placed in this encounter.   No orders of the defined types were placed in this encounter.   Return in about 3 months (around 09/18/2021) for F/U, med refill, Fayetta Sorenson PCP.   Total time spent:30 Minutes Time spent includes review of chart, medications, test results, and follow up plan with the patient.   [place] Controlled Substance Database was reviewed by me.  This patient was seen by Sallyanne Kuster, FNP-C in collaboration with Dr. Beverely Risen as a part of collaborative care agreement.  Lillion Elbert R. Tedd Sias, MSN, FNP-C Internal medicine

## 2021-08-30 ENCOUNTER — Ambulatory Visit: Payer: Self-pay | Admitting: Nurse Practitioner

## 2021-08-30 ENCOUNTER — Encounter: Payer: Self-pay | Admitting: Nurse Practitioner

## 2021-08-30 ENCOUNTER — Other Ambulatory Visit: Payer: Self-pay

## 2021-08-30 VITALS — BP 131/84 | HR 89 | Temp 98.3°F | Resp 16 | Ht 68.0 in | Wt 222.6 lb

## 2021-08-30 DIAGNOSIS — R590 Localized enlarged lymph nodes: Secondary | ICD-10-CM

## 2021-08-30 NOTE — Progress Notes (Signed)
[facility] [address] [place], [place] [address]  Internal MEDICINE  Office Visit Note  Patient Name: Claudia Melendez  [id number]  [id number]  Date of Service: 08/30/2021  Chief Complaint  Patient presents with   Acute Visit    Lump on left side of face / jaw, dime sized, noticed it last night, seems harder this morning, tender     HPI Claudia Melendez presents for an acute sick visit for a lump that she found on the left side of her face a long the jaw. She has a history of intermittent cystic acne. This lump is tender, pain is 6/10, was soft at first, got hard overnight.     Current Medication:  Outpatient Encounter Medications as of 08/30/2021  Medication Sig   busPIRone (BUSPAR) 10 MG tablet Take 1 tablet (10 mg total) by mouth 2 (two) times daily.   Norgestimate-Ethinyl Estradiol Triphasic (TRI-LO-MARZIA) 0.18/0.215/0.25 MG-25 MCG tab Take 1 tablet by mouth daily.   pseudoephedrine (SUDAFED) 30 MG tablet Take 30 mg by mouth every 4 (four) hours as needed for congestion.   Tdap (ADACEL) 02-26-14.5 LF-MCG/0.5 injection Inject 0.5 mLs into the muscle once.   No facility-administered encounter medications on file as of 08/30/2021.      Medical History: Past Medical History:  Diagnosis Date   Allergies    B12 deficiency    Iron (Fe) deficiency anemia    Migraines    Vitamin D deficiency      Vital Signs: BP 131/84   Pulse 89   Temp 98.3 F (36.8 C)   Resp 16   Ht 5\' 8"  (1.727 m)   Wt 222 lb 9.6 oz (101 kg)   SpO2 98%   BMI 33.85 kg/m    Review of Systems  Constitutional: Negative.  Negative for chills, fatigue and fever.  HENT: Negative.  Negative for congestion, ear pain, hearing loss, postnasal drip, rhinorrhea, sinus pressure, sinus pain, sneezing and sore throat.   Eyes: Negative.  Negative for pain.  Respiratory: Negative.  Negative for cough, chest tightness, shortness of breath and wheezing.   Cardiovascular: Negative.  Negative for chest  pain and palpitations.  Gastrointestinal: Negative.  Negative for abdominal pain, constipation, diarrhea, nausea and vomiting.  Skin:  Negative for rash.       Lump under skin    Physical Exam Vitals reviewed.  Constitutional:      General: She is not in acute distress.    Appearance: Normal appearance. She is obese. She is not ill-appearing.  HENT:     Head: Normocephalic and atraumatic.  Eyes:     Extraocular Movements: Extraocular movements intact.     Pupils: Pupils are equal, round, and reactive to light.  Cardiovascular:     Rate and Rhythm: Normal rate and regular rhythm.  Pulmonary:     Effort: Pulmonary effort is normal. No respiratory distress.  Skin:    Findings: Lesion (bump under skin on left side of face near ear, 1 cm in diameter.) present.  Neurological:     Mental Status: She is alert and oriented to person, place, and time.  Psychiatric:        Mood and Affect: Mood normal.        Behavior: Behavior normal.      Assessment/Plan: 1. Periauricular lymphadenopathy Lump under skin possible swollen lymph node vs developing cyst/cystic acne lesion. Encouraged patient to watching the area for worsening or further development of symptoms. If area is tender and bothering her,  encouraged her to take OTC tylenol.    General Counseling: alveda vanhorne understanding of the findings of todays visit and agrees with plan of treatment. I have discussed any further diagnostic evaluation that may be needed or ordered today. We also reviewed her medications today. she has been encouraged to call the office with any questions or concerns that should arise related to todays visit.    Counseling:    No orders of the defined types were placed in this encounter.   No orders of the defined types were placed in this encounter.   Return if symptoms worsen or fail to improve.  [place] Controlled Substance Database was reviewed by me for overdose risk score (ORS)  Time  spent:15 Minutes Time spent with patient included reviewing progress notes, labs, imaging studies, and discussing plan for follow up.   This patient was seen by Sallyanne Kuster, FNP-C in collaboration with Dr. Beverely Risen as a part of collaborative care agreement.  Bradyn Soward R. Tedd Sias, MSN, FNP-C Internal Medicine

## 2021-09-17 ENCOUNTER — Ambulatory Visit: Payer: Medicaid Other | Admitting: Nurse Practitioner

## 2021-09-24 ENCOUNTER — Telehealth (INDEPENDENT_AMBULATORY_CARE_PROVIDER_SITE_OTHER): Payer: Self-pay | Admitting: Nurse Practitioner

## 2021-09-24 ENCOUNTER — Encounter: Payer: Self-pay | Admitting: Nurse Practitioner

## 2021-09-24 ENCOUNTER — Other Ambulatory Visit: Payer: Self-pay

## 2021-09-24 VITALS — HR 99 | Temp 97.1°F | Resp 16 | Ht 67.0 in | Wt 215.0 lb

## 2021-09-24 DIAGNOSIS — J069 Acute upper respiratory infection, unspecified: Secondary | ICD-10-CM

## 2021-09-24 DIAGNOSIS — U071 COVID-19: Secondary | ICD-10-CM

## 2021-09-24 DIAGNOSIS — R051 Acute cough: Secondary | ICD-10-CM

## 2021-09-24 MED ORDER — NIRMATRELVIR/RITONAVIR (PAXLOVID)TABLET: 3.0000 | ORAL_TABLET | Freq: Two times a day (BID) | ORAL | 0 refills | Status: AC

## 2021-09-24 NOTE — Progress Notes (Signed)
[facility] [address] [place], [place] [address]  Internal MEDICINE  Telephone Visit  Patient Name: Claudia Melendez  [id number]  [id number]  Date of Service: 09/24/2021  I connected with the patient at 11:15 AM by telephone and verified the patients identity using two identifiers.   I discussed the limitations, risks, security and privacy concerns of performing an evaluation and management service by telephone and the availability of in person appointments. I also discussed with the patient that there may be a patient responsible charge related to the service.  The patient expressed understanding and agrees to proceed.    Chief Complaint  Patient presents with   Acute Visit    Tested pos. For covid 09-23-21, was throwing up Friday morning, poor appetite    Telephone Assessment    [phone]    Telephone Screen    Video call   Cough   Sinusitis    Nose bleeds, congestion     HPI Feiga presents for a telehealth virtual visit for covid infection. She reports that she tested positive for covid yesterday. Her symptoms are vomiting, poor appetite, cough, congestion and nose bleeds. She is taking mucinex DM to help with cough and congestion.    Current Medication: Outpatient Encounter Medications as of 09/24/2021  Medication Sig   busPIRone (BUSPAR) 10 MG tablet Take 1 tablet (10 mg total) by mouth 2 (two) times daily.   Norgestimate-Ethinyl Estradiol Triphasic (TRI-LO-MARZIA) 0.18/0.215/0.25 MG-25 MCG tab Take 1 tablet by mouth daily.   pseudoephedrine (SUDAFED) 30 MG tablet Take 30 mg by mouth every 4 (four) hours as needed for congestion.   Tdap (ADACEL) 02-26-14.5 LF-MCG/0.5 injection Inject 0.5 mLs into the muscle once.   nirmatrelvir/ritonavir EUA (PAXLOVID) 20 x 150 MG & 10 x 100MG  TABS Take 3 tablets by mouth 2 (two) times daily for 5 days.   No facility-administered encounter medications on file as of 09/24/2021.    Surgical History: Past Surgical  History:  Procedure Laterality Date   corrective eye surgery     toncillectomy      Medical History: Past Medical History:  Diagnosis Date   Allergies    B12 deficiency    Iron (Fe) deficiency anemia    Migraines    Vitamin D deficiency     Family History: Family History  Problem Relation Age of Onset   Cancer Father        mouth   Colon cancer Paternal Grandmother     Social History   Socioeconomic History   Marital status: Single    Spouse name: Not on file   Number of children: Not on file   Years of education: Not on file   Highest education level: Not on file  Occupational History   Not on file  Tobacco Use   Smoking status: Never   Smokeless tobacco: Never  Vaping Use   Vaping Use: Never used  Substance and Sexual Activity   Alcohol use: No   Drug use: No   Sexual activity: Not on file  Other Topics Concern   Not on file  Social History Narrative   Not on file   Social Determinants of Health   Financial Resource Strain: Not on file  Food Insecurity: Not on file  Transportation Needs: Not on file  Physical Activity: Not on file  Stress: Not on file  Social Connections: Not on file  Intimate Partner Violence: Not on file      Review of Systems  Constitutional:  Negative for chills, fatigue and fever.  HENT:  Positive for congestion, nosebleeds, postnasal drip, rhinorrhea, sinus pressure, sinus pain and sore throat. Negative for ear discharge.   Eyes:  Negative for pain.  Respiratory:  Positive for cough. Negative for chest tightness, shortness of breath and wheezing.   Cardiovascular: Negative.  Negative for chest pain and palpitations.  Gastrointestinal: Negative.  Negative for abdominal pain, constipation, diarrhea, nausea and vomiting.  Musculoskeletal:  Negative for myalgias.  Skin:  Negative for rash.  Neurological: Negative.  Negative for dizziness, light-headedness and headaches.   Vital Signs: Pulse 99   Temp (!) 97.1 F (36.2 C)    Resp 16   Ht 5\' 7"  (1.702 m)   Wt 215 lb (97.5 kg)   SpO2 97%   BMI 33.67 kg/m    Observation/Objective: She is alert and oriented and engages in conversation appropriately. She does not appear to be in any acute distress over video call.     Assessment/Plan: 1. Upper respiratory tract infection due to COVID-19 virus Paxlovid prescribed. Medication offered in case patient would like to take it as she is not sure she is going to.  - nirmatrelvir/ritonavir EUA (PAXLOVID) 20 x 150 MG & 10 x 100MG  TABS; Take 3 tablets by mouth 2 (two) times daily for 5 days.  Dispense: 30 tablet; Refill: 0  2. Acute cough Patient has cough medication available at home if she needs it.    General Counseling: braeleigh pyper understanding of the findings of today's phone visit and agrees with plan of treatment. I have discussed any further diagnostic evaluation that may be needed or ordered today. We also reviewed her medications today. she has been encouraged to call the office with any questions or concerns that should arise related to todays visit.  Return if symptoms worsen or fail to improve.   No orders of the defined types were placed in this encounter.   Meds ordered this encounter  Medications   nirmatrelvir/ritonavir EUA (PAXLOVID) 20 x 150 MG & 10 x 100MG  TABS    Sig: Take 3 tablets by mouth 2 (two) times daily for 5 days.    Dispense:  30 tablet    Refill:  0     Time spent:10 Minutes Time spent with patient included reviewing progress notes, labs, imaging studies, and discussing plan for follow up.  [place] Controlled Substance Database was reviewed by me for overdose risk score (ORS) if appropriate.  This patient was seen by , FNP-C in collaboration with Dr. Dellis Filbert as a part of collaborative care agreement.  Yuvia Plant R. , MSN, FNP-C Internal medicine

## 2022-02-07 ENCOUNTER — Telehealth (INDEPENDENT_AMBULATORY_CARE_PROVIDER_SITE_OTHER): Payer: BC Managed Care – PPO | Admitting: Nurse Practitioner

## 2022-02-07 ENCOUNTER — Encounter: Payer: Self-pay | Admitting: Nurse Practitioner

## 2022-02-07 VITALS — HR 105 | Temp 97.2°F | Resp 16

## 2022-02-07 DIAGNOSIS — J301 Allergic rhinitis due to pollen: Secondary | ICD-10-CM

## 2022-02-07 MED ORDER — MONTELUKAST SODIUM 10 MG PO TABS: 10.0000 mg | ORAL_TABLET | Freq: Every day | ORAL | 3 refills | Status: DC

## 2022-02-07 NOTE — Progress Notes (Signed)
[facility] ?[address] ?[place], [place] [address] ? ?Internal MEDICINE  ?Telephone Visit ? ?Patient Name: Claudia Melendez ? ID:[id number]  ?IB:[id number] ? ?Date of Service: 02/07/2022 ? ?I connected with the patient at 5:00 PM by telephone and verified the patients identity using two identifiers.   ?I discussed the limitations, risks, security and privacy concerns of performing an evaluation and management service by telephone and the availability of in person appointments. I also discussed with the patient that there may be a patient responsible charge related to the service.  The patient expressed understanding and agrees to proceed.   ? ?Chief Complaint  ?Patient presents with  ? Telephone Screen  ? Allergies  ?  Allergies are not improving   ? Telephone Assessment  ?  [phone]  ? ? ?HPI ?Claudia Melendez presents for a telehealth virtual visit for severe seasonal allergic rhinitis. She reports that she has severe symptoms of watery eyes, sneezing, itchy eyes, runny nose, sinus drainage after only 15 minutes of being outside. She has been taking OTC zyrtec and flonase nasal spray. This has not been effective.  ? ? ?Current Medication: ?Outpatient Encounter Medications as of 02/07/2022  ?Medication Sig  ? busPIRone (BUSPAR) 10 MG tablet Take 1 tablet (10 mg total) by mouth 2 (two) times daily.  ? montelukast (SINGULAIR) 10 MG tablet Take 1 tablet (10 mg total) by mouth at bedtime.  ? Norgestimate-Ethinyl Estradiol Triphasic (TRI-LO-MARZIA) 0.18/0.215/0.25 MG-25 MCG tab Take 1 tablet by mouth daily.  ? pseudoephedrine (SUDAFED) 30 MG tablet Take 30 mg by mouth every 4 (four) hours as needed for congestion.  ? Tdap (ADACEL) 02-26-14.5 LF-MCG/0.5 injection Inject 0.5 mLs into the muscle once.  ? ?No facility-administered encounter medications on file as of 02/07/2022.  ? ? ?Surgical History: ?Past Surgical History:  ?Procedure Laterality Date  ? corrective eye surgery    ? toncillectomy    ? ? ?Medical  History: ?Past Medical History:  ?Diagnosis Date  ? Allergies   ? B12 deficiency   ? Iron (Fe) deficiency anemia   ? Migraines   ? Vitamin D deficiency   ? ? ?Family History: ?Family History  ?Problem Relation Age of Onset  ? Cancer Father   ?     mouth  ? Colon cancer Paternal Grandmother   ? ? ?Social History  ? ?Socioeconomic History  ? Marital status: Single  ?  Spouse name: Not on file  ? Number of children: Not on file  ? Years of education: Not on file  ? Highest education level: Not on file  ?Occupational History  ? Not on file  ?Tobacco Use  ? Smoking status: Never  ? Smokeless tobacco: Never  ?Vaping Use  ? Vaping Use: Never used  ?Substance and Sexual Activity  ? Alcohol use: No  ? Drug use: No  ? Sexual activity: Not on file  ?Other Topics Concern  ? Not on file  ?Social History Narrative  ? Not on file  ? ?Social Determinants of Health  ? ?Financial Resource Strain: Not on file  ?Food Insecurity: Not on file  ?Transportation Needs: Not on file  ?Physical Activity: Not on file  ?Stress: Not on file  ?Social Connections: Not on file  ?Intimate Partner Violence: Not on file  ? ? ? ? ?Review of Systems  ?Constitutional: Negative.   ?HENT:  Positive for congestion, postnasal drip, rhinorrhea and sneezing. Negative for sinus pressure, sinus pain, sore throat and trouble swallowing.   ?Eyes:  Positive for discharge, redness and itching.  ?Respiratory: Negative.  Negative for cough, chest tightness, shortness of breath and wheezing.   ?Cardiovascular: Negative.  Negative for chest pain and palpitations.  ?Neurological:  Negative for headaches.  ? ?Vital Signs: ?Pulse (!) 105   Temp (!) 97.2 ?F (36.2 ?C)   Resp 16   SpO2 98%  ? ? ?Observation/Objective: ? ? ? ? ?Assessment/Plan: ?1. Seasonal allergic rhinitis due to pollen ?Continue OTC zyrtec 10 mg daily in the morning. Start montelukast 10 mg at bedtime. Patient also instructed to try OTC nasocort nasal spray instead of flonase. Instructed patient to call  the clinic if her symptoms do not improve or worsen after 1-2 weeks.  ?- montelukast (SINGULAIR) 10 MG tablet; Take 1 tablet (10 mg total) by mouth at bedtime.  Dispense: 30 tablet; Refill: 3 ? ? ?General Counseling: Claudia Melendez understanding of the findings of today's phone visit and agrees with plan of treatment. I have discussed any further diagnostic evaluation that may be needed or ordered today. We also reviewed her medications today. she has been encouraged to call the office with any questions or concerns that should arise related to todays visit. ? ?Return if symptoms worsen or fail to improve. ? ? ?No orders of the defined types were placed in this encounter. ? ? ?Meds ordered this encounter  ?Medications  ? montelukast (SINGULAIR) 10 MG tablet  ?  Sig: Take 1 tablet (10 mg total) by mouth at bedtime.  ?  Dispense:  30 tablet  ?  Refill:  3  ? ? ?Time spent:30 Minutes ?Time spent with patient included reviewing progress notes, labs, imaging studies, and discussing plan for follow up.  ?[place] Controlled Substance Database was reviewed by me for overdose risk score (ORS) if appropriate. ? ?This patient was seen by Jonetta Osgood, FNP-C in collaboration with Dr. Clayborn Bigness as a part of collaborative care agreement. ? ?Gillis Boardley R. Valetta Fuller, MSN, FNP-C ?Internal medicine  ?

## 2022-03-26 ENCOUNTER — Encounter: Payer: Self-pay | Admitting: Nurse Practitioner

## 2022-03-26 ENCOUNTER — Ambulatory Visit (INDEPENDENT_AMBULATORY_CARE_PROVIDER_SITE_OTHER): Payer: BC Managed Care – PPO | Admitting: Nurse Practitioner

## 2022-03-26 VITALS — BP 143/75 | HR 79 | Temp 98.5°F | Resp 16 | Ht 67.0 in | Wt 227.2 lb

## 2022-03-26 DIAGNOSIS — R040 Epistaxis: Secondary | ICD-10-CM | POA: Diagnosis not present

## 2022-03-26 DIAGNOSIS — G43009 Migraine without aura, not intractable, without status migrainosus: Secondary | ICD-10-CM | POA: Diagnosis not present

## 2022-03-26 DIAGNOSIS — J301 Allergic rhinitis due to pollen: Secondary | ICD-10-CM | POA: Diagnosis not present

## 2022-03-26 MED ORDER — OXYMETAZOLINE HCL 0.05 % NA SOLN: NASAL | 0 refills | Status: AC

## 2022-03-26 NOTE — Progress Notes (Signed)
[facility] [address] [place], [place] [address]  Internal MEDICINE  Office Visit Note  Patient Name: Claudia Melendez  [id number]  [id number]  Date of Service: 03/26/2022  Chief Complaint  Patient presents with   Acute Visit    Nose bleeds over the last 7 to 10 days, 1 to 2 nose bleeds a day, headaches, tends to fade a bit after nose bleed is done   Hypertension     HPI Claudia Melendez presents for an acute sick visit for frequent and recurrent nosebleeds.  She has had this issue in the past but it has become more frequent recently with nosebleeds for the past 7 to 10 days and she reports a frequency of 1 to 2/day.  The nosebleeds she describes as "gushers" and stated that the longest her nose has ever blood without stopping was 45 minutes.  She has seen an ENT specialist about the nosebleeds in the past and she has a superficial blood vessel that bleeds easily and they have discussed cauterizing in the past but she declined because she has to blow her nose frequently due to sinus and allergy issues.  She used Flonase a lot when she was younger and a previous provider told her that that is why she has this issue with nosebleeds now. She has had headaches after the nosebleeds and reports that her blood pressure has been higher as of late with a reported home blood pressure of 150/99 when she checked it at [facility]. She has increased stressors at home with a lot of family drama and stress related to her mother and father's relationship.  Per the patient's grandmother, her mother can be very manipulative and often lies to the patient to get attention.  When discussed with the patient, she states that this has been an ongoing thing for years and it does increase her stress level and contributes to her anxiety.   Severe nosebleeds 1-2 nosebleeds a day. Headaches after nosebleeds 150/99 117/78 ENT?   Current Medication:  Outpatient Encounter Medications as of 03/26/2022  Medication  Sig   busPIRone (BUSPAR) 10 MG tablet Take 1 tablet (10 mg total) by mouth 2 (two) times daily.   montelukast (SINGULAIR) 10 MG tablet Take 1 tablet (10 mg total) by mouth at bedtime.   Norgestimate-Ethinyl Estradiol Triphasic (TRI-LO-MARZIA) 0.18/0.215/0.25 MG-25 MCG tab Take 1 tablet by mouth daily.   oxymetazoline (AFRIN) 0.05 % nasal spray Use 2 sprays into affected nostril as needed for active nosebleed, may repeat x1 after 15 minutes.   pseudoephedrine (SUDAFED) 30 MG tablet Take 30 mg by mouth every 4 (four) hours as needed for congestion.   Tdap (ADACEL) 02-26-14.5 LF-MCG/0.5 injection Inject 0.5 mLs into the muscle once.   No facility-administered encounter medications on file as of 03/26/2022.      Medical History: Past Medical History:  Diagnosis Date   Allergies    B12 deficiency    Iron (Fe) deficiency anemia    Migraines    Vitamin D deficiency      Vital Signs: BP (!) 143/75   Pulse 79   Temp 98.5 F (36.9 C)   Resp 16   Ht 5\' 7"  (1.702 m)   Wt 227 lb 3.2 oz (103.1 kg)   SpO2 98%   BMI 35.58 kg/m    Review of Systems  Constitutional: Negative.   HENT:  Positive for congestion, nosebleeds, postnasal drip, rhinorrhea and sneezing.   Eyes: Negative.  Negative for pain.  Respiratory:  Negative  for cough, chest tightness, shortness of breath and wheezing.   Cardiovascular: Negative.  Negative for chest pain and palpitations.  Gastrointestinal: Negative.   Neurological:  Positive for headaches.  Psychiatric/Behavioral:  Negative for self-injury, sleep disturbance and suicidal ideas. The patient is nervous/anxious.    Physical Exam Vitals reviewed.  Constitutional:      General: She is not in acute distress.    Appearance: Normal appearance. She is obese. She is not ill-appearing.  HENT:     Head: Normocephalic and atraumatic.     Nose: Mucosal edema and congestion present. No nasal deformity, septal deviation, signs of injury or rhinorrhea.     Right  Nostril: No epistaxis, septal hematoma or occlusion.     Left Nostril: No epistaxis, septal hematoma or occlusion.     Right Turbinates: Swollen. Not pale.     Left Turbinates: Swollen. Not pale.     Right Sinus: No maxillary sinus tenderness or frontal sinus tenderness.     Left Sinus: No maxillary sinus tenderness or frontal sinus tenderness.     Mouth/Throat:     Mouth: Mucous membranes are moist.     Pharynx: Oropharynx is clear. No oropharyngeal exudate or posterior oropharyngeal erythema.  Eyes:     Pupils: Pupils are equal, round, and reactive to light.  Cardiovascular:     Rate and Rhythm: Normal rate and regular rhythm.  Pulmonary:     Effort: Pulmonary effort is normal. No respiratory distress.  Neurological:     Mental Status: She is alert and oriented to person, place, and time.  Psychiatric:        Mood and Affect: Mood normal.        Behavior: Behavior normal.      Assessment/Plan: 1. Recurrent epistaxis Patient instructed to use over-the-counter Afrin when she is having an active nosebleed.  She was instructed to administer 2 sprays in the affected nostril and then she may repeat this dose x1 after 15 minutes.  Patient instructed to go to urgent care or ER if she is experiencing a severe nosebleed that will not stop with Afrin and other nonpharmacological interventions as discussed with patient.  A new ENT referral is ordered to be reevaluated for the recurrent nosebleeds to see if there is anything that could be done to help prevent future nosebleeds.  Due to the patient describing the nosebleeds as "gushers", CBC ordered to evaluate for anemia due to blood loss - oxymetazoline (AFRIN) 0.05 % nasal spray; Use 2 sprays into affected nostril as needed for active nosebleed, may repeat x1 after 15 minutes.  Dispense: 30 mL; Refill: 0 - Ambulatory referral to ENT - CBC with Differential/Platelet  2. Seasonal allergic rhinitis due to pollen Patient does have seasonal  allergic rhinitis and often has sinus problems with sneezing, runny nose and nasal congestion and reports that she chronically has to blow her nose frequently when her allergies are bothering her.  This is also another problem that can be addressed and further evaluated with ENT.   General Counseling: peggie hornak understanding of the findings of todays visit and agrees with plan of treatment. I have discussed any further diagnostic evaluation that may be needed or ordered today. We also reviewed her medications today. she has been encouraged to call the office with any questions or concerns that should arise related to todays visit.    Counseling:    Orders Placed This Encounter  Procedures   CBC with Differential/Platelet   Ambulatory referral to ENT  Meds ordered this encounter  Medications   oxymetazoline (AFRIN) 0.05 % nasal spray    Sig: Use 2 sprays into affected nostril as needed for active nosebleed, may repeat x1 after 15 minutes.    Dispense:  30 mL    Refill:  0    Return if symptoms worsen or fail to improve.  [place] Controlled Substance Database was reviewed by me for overdose risk score (ORS)  Time spent:20 Minutes Time spent with patient included reviewing progress notes, labs, imaging studies, and discussing plan for follow up.   This patient was seen by Sallyanne KusterAlyssa Chavie Kolinski, FNP-C in collaboration with Dr. Beverely RisenFozia Khan as a part of collaborative care agreement.  Dink Creps R. Tedd SiasAbernathy, MSN, FNP-C Internal Medicine

## 2022-03-27 ENCOUNTER — Encounter: Payer: Self-pay | Admitting: Nurse Practitioner

## 2022-03-27 DIAGNOSIS — R040 Epistaxis: Secondary | ICD-10-CM | POA: Diagnosis not present

## 2022-03-28 LAB — CBC WITH DIFFERENTIAL/PLATELET
Basophils Absolute: 0.1 10*3/uL (ref 0.0–0.2)
Basos: 1 %
EOS (ABSOLUTE): 0.2 10*3/uL (ref 0.0–0.4)
Eos: 2 %
Hematocrit: 39.6 % (ref 34.0–46.6)
Hemoglobin: 13.6 g/dL (ref 11.1–15.9)
Immature Grans (Abs): 0 10*3/uL (ref 0.0–0.1)
Immature Granulocytes: 0 %
Lymphocytes Absolute: 2.5 10*3/uL (ref 0.7–3.1)
Lymphs: 38 %
MCH: 30.7 pg (ref 26.6–33.0)
MCHC: 34.3 g/dL (ref 31.5–35.7)
MCV: 89 fL (ref 79–97)
Monocytes Absolute: 0.6 10*3/uL (ref 0.1–0.9)
Monocytes: 9 %
Neutrophils Absolute: 3.2 10*3/uL (ref 1.4–7.0)
Neutrophils: 50 %
Platelets: 352 10*3/uL (ref 150–450)
RBC: 4.43 x10E6/uL (ref 3.77–5.28)
RDW: 12.3 % (ref 11.7–15.4)
WBC: 6.5 10*3/uL (ref 3.4–10.8)

## 2022-03-28 NOTE — Progress Notes (Signed)
Please let her know she is not anemic and to still see ENT when they call to make the appointment

## 2022-03-29 ENCOUNTER — Telehealth: Payer: Self-pay

## 2022-03-29 MED ORDER — SUMATRIPTAN SUCCINATE 50 MG PO TABS: 50.0000 mg | ORAL_TABLET | ORAL | 0 refills | Status: DC | PRN

## 2022-03-29 NOTE — Addendum Note (Signed)
Addended by: Sallyanne Kuster on: 03/29/2022 03:46 PM   Modules accepted: Orders

## 2022-03-29 NOTE — Telephone Encounter (Signed)
error 

## 2022-03-29 NOTE — Telephone Encounter (Signed)
-----   Message from Jonetta Osgood, NP sent at 03/28/2022  7:01 AM EDT ----- Please let her know she is not anemic and to still see ENT when they call to make the appointment

## 2022-03-29 NOTE — Telephone Encounter (Signed)
Spoke to pt, provided results  

## 2022-04-01 ENCOUNTER — Telehealth: Payer: Self-pay

## 2022-04-01 NOTE — Telephone Encounter (Signed)
Otolaryngology referral sent via [facility] to [facility] 

## 2022-04-03 NOTE — Telephone Encounter (Signed)
04/18/22 appointment with [facility]

## 2022-04-12 ENCOUNTER — Other Ambulatory Visit: Payer: Self-pay | Admitting: Nurse Practitioner

## 2022-04-12 ENCOUNTER — Telehealth: Payer: Self-pay

## 2022-04-12 DIAGNOSIS — N921 Excessive and frequent menstruation with irregular cycle: Secondary | ICD-10-CM

## 2022-04-12 MED ORDER — SUMATRIPTAN SUCCINATE 100 MG PO TABS: 100.0000 mg | ORAL_TABLET | ORAL | 3 refills | Status: DC | PRN

## 2022-04-12 NOTE — Telephone Encounter (Signed)
Pt's grandmother called requesting increase to 100 mg for Imitrex due to the 50 mg not working and per Arlyss Repress was ok to change and sent new rx to pharmacy

## 2022-04-12 NOTE — Telephone Encounter (Signed)
Pt grandmother advised that desha change Imitrex 100 mg as per alyssa and send to phar

## 2022-04-18 DIAGNOSIS — J342 Deviated nasal septum: Secondary | ICD-10-CM | POA: Diagnosis not present

## 2022-04-18 DIAGNOSIS — R040 Epistaxis: Secondary | ICD-10-CM | POA: Diagnosis not present

## 2022-04-18 DIAGNOSIS — J343 Hypertrophy of nasal turbinates: Secondary | ICD-10-CM | POA: Diagnosis not present

## 2022-05-02 DIAGNOSIS — R040 Epistaxis: Secondary | ICD-10-CM | POA: Diagnosis not present

## 2022-05-02 DIAGNOSIS — J342 Deviated nasal septum: Secondary | ICD-10-CM | POA: Diagnosis not present

## 2022-05-02 DIAGNOSIS — J343 Hypertrophy of nasal turbinates: Secondary | ICD-10-CM | POA: Diagnosis not present

## 2022-05-06 ENCOUNTER — Other Ambulatory Visit: Payer: Self-pay | Admitting: Nurse Practitioner

## 2022-05-06 DIAGNOSIS — J301 Allergic rhinitis due to pollen: Secondary | ICD-10-CM

## 2022-06-03 ENCOUNTER — Encounter: Payer: BC Managed Care – PPO | Admitting: Nurse Practitioner

## 2022-06-12 DIAGNOSIS — F411 Generalized anxiety disorder: Secondary | ICD-10-CM | POA: Diagnosis not present

## 2022-06-12 DIAGNOSIS — F330 Major depressive disorder, recurrent, mild: Secondary | ICD-10-CM | POA: Diagnosis not present

## 2022-06-13 DIAGNOSIS — J343 Hypertrophy of nasal turbinates: Secondary | ICD-10-CM | POA: Diagnosis not present

## 2022-06-13 DIAGNOSIS — J342 Deviated nasal septum: Secondary | ICD-10-CM | POA: Diagnosis not present

## 2022-06-19 ENCOUNTER — Telehealth: Payer: Self-pay

## 2022-06-19 NOTE — Telephone Encounter (Signed)
Left vm and sent mychart message to confirm 06/26/22 appointment-[facility]

## 2022-06-26 ENCOUNTER — Ambulatory Visit (INDEPENDENT_AMBULATORY_CARE_PROVIDER_SITE_OTHER): Payer: BC Managed Care – PPO | Admitting: Nurse Practitioner

## 2022-06-26 ENCOUNTER — Encounter: Payer: Self-pay | Admitting: Nurse Practitioner

## 2022-06-26 VITALS — BP 125/80 | HR 86 | Temp 97.8°F | Resp 16 | Ht 67.0 in | Wt 226.0 lb

## 2022-06-26 DIAGNOSIS — G44221 Chronic tension-type headache, intractable: Secondary | ICD-10-CM

## 2022-06-26 DIAGNOSIS — R195 Other fecal abnormalities: Secondary | ICD-10-CM | POA: Diagnosis not present

## 2022-06-26 DIAGNOSIS — R748 Abnormal levels of other serum enzymes: Secondary | ICD-10-CM

## 2022-06-26 DIAGNOSIS — G43009 Migraine without aura, not intractable, without status migrainosus: Secondary | ICD-10-CM

## 2022-06-26 DIAGNOSIS — R300 Dysuria: Secondary | ICD-10-CM

## 2022-06-26 DIAGNOSIS — J301 Allergic rhinitis due to pollen: Secondary | ICD-10-CM

## 2022-06-26 DIAGNOSIS — Z0001 Encounter for general adult medical examination with abnormal findings: Secondary | ICD-10-CM

## 2022-06-26 MED ORDER — RIZATRIPTAN BENZOATE 10 MG PO TABS: 10.0000 mg | ORAL_TABLET | ORAL | 0 refills | Status: DC | PRN

## 2022-06-26 NOTE — Progress Notes (Signed)
[facility] [place], [place] [address]  Internal MEDICINE  Office Visit Note  Patient Name: Claudia Melendez  [id number]  [id number]  Date of Service: 06/26/2022  Chief Complaint  Patient presents with   Annual Exam   Anemia    HPI Chenika presents for an annual well visit and physical exam.  Well appearing 26 yo female with migraines, recurrent epistaxis and allergic rhinitis.  Recurrent epistaxis: went to ENT, blood vessel left nare cauterized, nosebleeds improved, only 1 since then.  Septoplasty scheduled to tx abnormal septum and enlarged turbinates per ENT  Migraines/headaches --constant baseline tension headache, full migraine incapacitating even with sumatriptan.  Has migraines 1-2 x a week, last 2 hours up to 8 hours. Associated symptoms: nausea, sensitivity to light or sound, pain worsens with movement. Laying down to try to sleep helps sometimes.   GI issues, loose stools, more frequent, no abdominal pain, bowel urgency, slight nausea, no vomiting, no heartburn, no blood in stool, no straining, no rectal pain, no distention or bloating, no change in appetite. Off and on for about 2 months, worsening over the past 2 weeks. Tried gas-x but did not help. Stool color is brown or clay.  Needs repeat CMP and lipid panel Seeing therapist/psychologist which is helping, has had 2 session so far.      Current Medication: Outpatient Encounter Medications as of 06/26/2022  Medication Sig   montelukast (SINGULAIR) 10 MG tablet TAKE 1 TABLET BY MOUTH EVERYDAY AT BEDTIME   oxymetazoline (AFRIN) 0.05 % nasal spray Use 2 sprays into affected nostril as needed for active nosebleed, may repeat x1 after 15 minutes.   pseudoephedrine (SUDAFED) 30 MG tablet Take 30 mg by mouth every 4 (four) hours as needed for congestion.   rizatriptan (MAXALT) 10 MG tablet Take 1 tablet (10 mg total) by mouth as needed for migraine. May repeat in 2 hours if needed   Tdap (ADACEL)  02-26-14.5 LF-MCG/0.5 injection Inject 0.5 mLs into the muscle once.   TRI-LO-MARZIA 0.18/0.215/0.25 MG-25 MCG tab TAKE 1 TABLET BY MOUTH EVERY DAY   [DISCONTINUED] busPIRone (BUSPAR) 10 MG tablet Take 1 tablet (10 mg total) by mouth 2 (two) times daily.   [DISCONTINUED] SUMAtriptan (IMITREX) 100 MG tablet Take 1 tablet (100 mg total) by mouth every 2 (two) hours as needed for migraine. May repeat in 2 hours if headache persists or recurs.   No facility-administered encounter medications on file as of 06/26/2022.    Surgical History: Past Surgical History:  Procedure Laterality Date   corrective eye surgery     toncillectomy      Medical History: Past Medical History:  Diagnosis Date   Allergies    B12 deficiency    Iron (Fe) deficiency anemia    Migraines    Vitamin D deficiency     Family History: Family History  Problem Relation Age of Onset   Cancer Father        mouth   Colon cancer Paternal Grandmother     Social History   Socioeconomic History   Marital status: Single    Spouse name: Not on file   Number of children: Not on file   Years of education: Not on file   Highest education level: Not on file  Occupational History   Not on file  Tobacco Use   Smoking status: Never   Smokeless tobacco: Never  Vaping Use   Vaping Use: Never used  Substance and Sexual Activity   Alcohol  use: No   Drug use: No   Sexual activity: Not on file  Other Topics Concern   Not on file  Social History Narrative   Not on file   Social Determinants of Health   Financial Resource Strain: Not on file  Food Insecurity: Not on file  Transportation Needs: Not on file  Physical Activity: Not on file  Stress: Not on file  Social Connections: Not on file  Intimate Partner Violence: Not on file      Review of Systems  Constitutional: Negative.  Negative for activity change, appetite change, chills, fatigue, fever and unexpected weight change.  HENT:  Positive for congestion,  nosebleeds, postnasal drip, rhinorrhea and sneezing. Negative for ear pain, sore throat and trouble swallowing.   Eyes: Negative.  Negative for pain.  Respiratory: Negative.  Negative for cough, chest tightness, shortness of breath and wheezing.   Cardiovascular: Negative.  Negative for chest pain and palpitations.  Gastrointestinal: Negative.  Negative for abdominal pain, blood in stool, constipation, diarrhea, nausea and vomiting.  Endocrine: Negative.   Genitourinary: Negative.  Negative for difficulty urinating, dysuria, frequency, hematuria and urgency.  Musculoskeletal: Negative.  Negative for arthralgias, back pain, joint swelling, myalgias and neck pain.  Skin: Negative.  Negative for rash and wound.  Allergic/Immunologic: Negative.  Negative for immunocompromised state.  Neurological:  Positive for headaches. Negative for dizziness, seizures and numbness.  Hematological: Negative.   Psychiatric/Behavioral:  Negative for behavioral problems, self-injury, sleep disturbance and suicidal ideas. The patient is nervous/anxious.     Vital Signs: BP 125/80   Pulse 86   Temp 97.8 F (36.6 C)   Resp 16   Ht $R'5\' 7"'WT$  (1.702 m)   Wt 226 lb (102.5 kg)   SpO2 99%   BMI 35.40 kg/m    Physical Exam Vitals reviewed.  Constitutional:      General: She is not in acute distress.    Appearance: Normal appearance. She is well-developed. She is obese. She is not ill-appearing or diaphoretic.  HENT:     Head: Normocephalic and atraumatic.     Right Ear: Tympanic membrane, ear canal and external ear normal.     Left Ear: Tympanic membrane, ear canal and external ear normal.     Nose: Mucosal edema and congestion present. No nasal deformity, septal deviation, signs of injury or rhinorrhea.     Right Nostril: No epistaxis, septal hematoma or occlusion.     Left Nostril: No epistaxis, septal hematoma or occlusion.     Right Turbinates: Swollen. Not pale.     Left Turbinates: Swollen. Not pale.      Right Sinus: No maxillary sinus tenderness or frontal sinus tenderness.     Left Sinus: No maxillary sinus tenderness or frontal sinus tenderness.     Mouth/Throat:     Mouth: Mucous membranes are moist.     Pharynx: Oropharynx is clear. No oropharyngeal exudate or posterior oropharyngeal erythema.  Eyes:     General: No scleral icterus.       Right eye: No discharge.        Left eye: No discharge.     Extraocular Movements: Extraocular movements intact.     Conjunctiva/sclera: Conjunctivae normal.     Pupils: Pupils are equal, round, and reactive to light.  Neck:     Thyroid: No thyromegaly.     Vascular: No JVD.     Trachea: No tracheal deviation.  Cardiovascular:     Rate and Rhythm: Normal rate and regular  rhythm.     Pulses: Normal pulses.     Heart sounds: Normal heart sounds. No murmur heard.    No friction rub. No gallop.  Pulmonary:     Effort: Pulmonary effort is normal. No respiratory distress.     Breath sounds: Normal breath sounds. No stridor. No wheezing or rales.  Chest:     Chest wall: No mass, lacerations, deformity, swelling or tenderness.  Breasts:    Right: Normal. No swelling, inverted nipple, mass, nipple discharge, skin change or tenderness.     Left: Normal. No swelling, inverted nipple, mass, nipple discharge, skin change or tenderness.  Abdominal:     General: Bowel sounds are normal. There is no distension.     Palpations: Abdomen is soft. There is no mass.     Tenderness: There is no abdominal tenderness. There is no guarding or rebound.  Musculoskeletal:        General: No tenderness or deformity. Normal range of motion.     Cervical back: Normal range of motion and neck supple.  Lymphadenopathy:     Cervical: No cervical adenopathy.     Upper Body:     Right upper body: No supraclavicular, axillary or pectoral adenopathy.     Left upper body: No supraclavicular, axillary or pectoral adenopathy.  Skin:    General: Skin is warm and dry.      Capillary Refill: Capillary refill takes less than 2 seconds.     Coloration: Skin is not pale.     Findings: No erythema or rash.  Neurological:     Mental Status: She is alert and oriented to person, place, and time.     Cranial Nerves: No cranial nerve deficit.     Motor: No abnormal muscle tone.     Coordination: Coordination normal.     Deep Tendon Reflexes: Reflexes are normal and symmetric.  Psychiatric:        Mood and Affect: Mood normal.        Behavior: Behavior normal.        Thought Content: Thought content normal.        Judgment: Judgment normal.       Assessment/Plan: 1. Encounter for general adult medical examination with abnormal findings Age-appropriate preventive screenings and vaccinations discussed, annual physical exam completed. Routine labs for health maintenance ordered, see below. PHM updated.  - CMP14+EGFR - Lipid Profile  2. Migraine without aura and without status migrainosus, not intractable Rizatriptan prescribed, will try this for acute migraine. Follow up in a few weeks  3. Elevated liver enzymes Repeat labs and also RUQ ultrasound ordered due to elevated liver enzymes and change in stool consistency. Follow up to discuss results - CMP14+EGFR - Lipid Profile  4. Change in consistency of stool Labs and ultrasound ordered, follow up to discuss results - CMP14+EGFR - Lipid Profile - US Abdomen Limited RUQ (LIVER/GB); Future  5. Dysuria Routine urinalysis done - UA/M w/rflx Culture, Routine - Microscopic Examination - Urine Culture, Reflex     General Counseling: Aneshia verbalizes understanding of the findings of todays visit and agrees with plan of treatment. I have discussed any further diagnostic evaluation that may be needed or ordered today. We also reviewed her medications today. she has been encouraged to call the office with any questions or concerns that should arise related to todays visit.    Orders Placed This Encounter   Procedures   US Abdomen Limited RUQ (LIVER/GB)   UA/M w/rflx Culture, Routine  CMP14+EGFR   Lipid Profile    Meds ordered this encounter  Medications   rizatriptan (MAXALT) 10 MG tablet    Sig: Take 1 tablet (10 mg total) by mouth as needed for migraine. May repeat in 2 hours if needed    Dispense:  10 tablet    Refill:  0    Discontinue sumatriptan, fill new prescription today.    Return for F/U, U/S @ [facility] PCP in a few weeks.   Total time spent:30 Minutes Time spent includes review of chart, medications, test results, and follow up plan with the patient.   [place] Controlled Substance Database was reviewed by me.  This patient was seen by Jonetta Osgood, FNP-C in collaboration with Dr. Clayborn Bigness as a part of collaborative care agreement.  Daphane Odekirk R. Valetta Fuller, MSN, FNP-C Internal medicine

## 2022-06-27 DIAGNOSIS — G43009 Migraine without aura, not intractable, without status migrainosus: Secondary | ICD-10-CM | POA: Diagnosis not present

## 2022-06-27 DIAGNOSIS — R300 Dysuria: Secondary | ICD-10-CM | POA: Diagnosis not present

## 2022-06-27 DIAGNOSIS — R748 Abnormal levels of other serum enzymes: Secondary | ICD-10-CM | POA: Diagnosis not present

## 2022-06-27 DIAGNOSIS — Z0001 Encounter for general adult medical examination with abnormal findings: Secondary | ICD-10-CM | POA: Diagnosis not present

## 2022-06-27 DIAGNOSIS — G44221 Chronic tension-type headache, intractable: Secondary | ICD-10-CM | POA: Diagnosis not present

## 2022-06-28 LAB — CMP14+EGFR
ALT: 27 IU/L (ref 0–32)
AST: 23 IU/L (ref 0–40)
Albumin/Globulin Ratio: 1.6 (ref 1.2–2.2)
Albumin: 4.4 g/dL (ref 4.0–5.0)
Alkaline Phosphatase: 46 IU/L (ref 44–121)
BUN/Creatinine Ratio: 10 (ref 9–23)
BUN: 8 mg/dL (ref 6–20)
Bilirubin Total: 0.5 mg/dL (ref 0.0–1.2)
CO2: 20 mmol/L (ref 20–29)
Calcium: 9.6 mg/dL (ref 8.7–10.2)
Chloride: 101 mmol/L (ref 96–106)
Creatinine, Ser: 0.83 mg/dL (ref 0.57–1.00)
Globulin, Total: 2.7 g/dL (ref 1.5–4.5)
Glucose: 91 mg/dL (ref 70–99)
Potassium: 4.7 mmol/L (ref 3.5–5.2)
Sodium: 136 mmol/L (ref 134–144)
Total Protein: 7.1 g/dL (ref 6.0–8.5)
eGFR: 102 mL/min/{1.73_m2} (ref >59)

## 2022-06-28 LAB — LIPID PANEL
Chol/HDL Ratio: 3.2 ratio (ref 0.0–4.4)
Cholesterol, Total: 132 mg/dL (ref 100–199)
HDL: 41 mg/dL (ref >39)
LDL Chol Calc (NIH): 60 mg/dL (ref 0–99)
Triglycerides: 188 mg/dL — ABNORMAL HIGH (ref 0–149)
VLDL Cholesterol Cal: 31 mg/dL (ref 5–40)

## 2022-06-29 LAB — UA/M W/RFLX CULTURE, ROUTINE
Bilirubin, UA: NEGATIVE
Glucose, UA: NEGATIVE
Ketones, UA: NEGATIVE
Leukocytes,UA: NEGATIVE
Nitrite, UA: NEGATIVE
Protein,UA: NEGATIVE
RBC, UA: NEGATIVE
Specific Gravity, UA: 1.01 (ref 1.005–1.030)
Urobilinogen, Ur: 0.2 mg/dL (ref 0.2–1.0)
pH, UA: 6 (ref 5.0–7.5)

## 2022-06-29 LAB — MICROSCOPIC EXAMINATION
Casts: NONE SEEN /lpf
RBC, Urine: NONE SEEN /hpf (ref 0–2)
WBC, UA: NONE SEEN /hpf (ref 0–5)

## 2022-06-29 LAB — URINE CULTURE, REFLEX

## 2022-07-03 DIAGNOSIS — F411 Generalized anxiety disorder: Secondary | ICD-10-CM | POA: Diagnosis not present

## 2022-07-03 DIAGNOSIS — F330 Major depressive disorder, recurrent, mild: Secondary | ICD-10-CM | POA: Diagnosis not present

## 2022-07-17 DIAGNOSIS — F411 Generalized anxiety disorder: Secondary | ICD-10-CM | POA: Diagnosis not present

## 2022-07-17 DIAGNOSIS — F330 Major depressive disorder, recurrent, mild: Secondary | ICD-10-CM | POA: Diagnosis not present

## 2022-07-22 ENCOUNTER — Ambulatory Visit (INDEPENDENT_AMBULATORY_CARE_PROVIDER_SITE_OTHER): Payer: BC Managed Care – PPO

## 2022-07-22 DIAGNOSIS — R195 Other fecal abnormalities: Secondary | ICD-10-CM

## 2022-07-31 DIAGNOSIS — F330 Major depressive disorder, recurrent, mild: Secondary | ICD-10-CM | POA: Diagnosis not present

## 2022-07-31 DIAGNOSIS — F411 Generalized anxiety disorder: Secondary | ICD-10-CM | POA: Diagnosis not present

## 2022-08-01 ENCOUNTER — Encounter: Payer: Self-pay | Admitting: Nurse Practitioner

## 2022-08-01 ENCOUNTER — Ambulatory Visit: Payer: BC Managed Care – PPO | Admitting: Nurse Practitioner

## 2022-08-01 VITALS — BP 135/85 | HR 82 | Temp 97.7°F | Resp 16 | Ht 67.0 in | Wt 225.4 lb

## 2022-08-01 DIAGNOSIS — K8020 Calculus of gallbladder without cholecystitis without obstruction: Secondary | ICD-10-CM | POA: Diagnosis not present

## 2022-08-01 NOTE — Progress Notes (Signed)
[facility] [place], [place] [address]  Internal MEDICINE  Office Visit Note  Patient Name: Claudia Melendez  [id number]  [id number]  Date of Service: 08/01/2022  Chief Complaint  Patient presents with   Follow-up    Follow up ultrasound    HPI Claudia Melendez presents for a follow-up visit to review RUQ ultrasound -- Ultrasound was ordered due to abnormal liver enzymes -- Ultrasound results showed gallstones in the gallbladder with no obstruction or inflammation and normal liver with no abnormalities.     Current Medication: Outpatient Encounter Medications as of 08/01/2022  Medication Sig   montelukast (SINGULAIR) 10 MG tablet TAKE 1 TABLET BY MOUTH EVERYDAY AT BEDTIME   oxymetazoline (AFRIN) 0.05 % nasal spray Use 2 sprays into affected nostril as needed for active nosebleed, may repeat x1 after 15 minutes.   pseudoephedrine (SUDAFED) 30 MG tablet Take 30 mg by mouth every 4 (four) hours as needed for congestion.   rizatriptan (MAXALT) 10 MG tablet Take 1 tablet (10 mg total) by mouth as needed for migraine. May repeat in 2 hours if needed   Tdap (ADACEL) 02-26-14.5 LF-MCG/0.5 injection Inject 0.5 mLs into the muscle once.   TRI-LO-MARZIA 0.18/0.215/0.25 MG-25 MCG tab TAKE 1 TABLET BY MOUTH EVERY DAY   No facility-administered encounter medications on file as of 08/01/2022.    Surgical History: Past Surgical History:  Procedure Laterality Date   corrective eye surgery     toncillectomy      Medical History: Past Medical History:  Diagnosis Date   Allergies    B12 deficiency    Iron (Fe) deficiency anemia    Migraines    Vitamin D deficiency     Family History: Family History  Problem Relation Age of Onset   Cancer Father        mouth   Colon cancer Paternal Grandmother     Social History   Socioeconomic History   Marital status: Single    Spouse name: Not on file   Number of children: Not on file   Years of education: Not on file    Highest education level: Not on file  Occupational History   Not on file  Tobacco Use   Smoking status: Never   Smokeless tobacco: Never  Vaping Use   Vaping Use: Never used  Substance and Sexual Activity   Alcohol use: No   Drug use: No   Sexual activity: Not on file  Other Topics Concern   Not on file  Social History Narrative   Not on file   Social Determinants of Health   Financial Resource Strain: Not on file  Food Insecurity: Not on file  Transportation Needs: Not on file  Physical Activity: Not on file  Stress: Not on file  Social Connections: Not on file  Intimate Partner Violence: Not on file      Review of Systems  Constitutional:  Negative for chills, fatigue and unexpected weight change.  HENT:  Negative for congestion, postnasal drip, rhinorrhea, sneezing and sore throat.   Eyes:  Negative for redness.  Respiratory: Negative.  Negative for cough, chest tightness and shortness of breath.   Cardiovascular: Negative.  Negative for chest pain and palpitations.  Gastrointestinal:  Positive for abdominal pain (Colicky), diarrhea and nausea. Negative for constipation and vomiting.  Genitourinary:  Negative for dysuria and frequency.  Musculoskeletal:  Negative for arthralgias, back pain, joint swelling and neck pain.  Skin:  Negative for rash.  Neurological: Negative.  Negative for tremors and numbness.  Psychiatric/Behavioral:  Negative for behavioral problems (Depression), sleep disturbance and suicidal ideas. The patient is not nervous/anxious.     Vital Signs: BP 135/85   Pulse 82   Temp 97.7 F (36.5 C)   Resp 16   Ht 5\' 7"  (1.702 m)   Wt 225 lb 6.4 oz (102.2 kg)   SpO2 98%   BMI 35.30 kg/m    Physical Exam Vitals reviewed.  Constitutional:      General: She is not in acute distress.    Appearance: Normal appearance. She is obese. She is not ill-appearing.  HENT:     Head: Normocephalic and atraumatic.  Eyes:     Pupils: Pupils are equal,  round, and reactive to light.  Cardiovascular:     Rate and Rhythm: Normal rate and regular rhythm.  Pulmonary:     Effort: Pulmonary effort is normal. No respiratory distress.  Neurological:     Mental Status: She is alert and oriented to person, place, and time.  Psychiatric:        Mood and Affect: Mood normal.        Behavior: Behavior normal.        Assessment/Plan: 1. Calculus of gallbladder without cholecystitis without obstruction Pain is intermittent and tolerable with OTC analgesics.  Discussed signs and symptoms of a gallbladder attack/worsening of condition that would indicate need for specialist referral Informational handouts will be provided to the patient at checkout today regarding cholelithiasis and a possible complication of gallstones, cholecystitis.  Patient verbalized that she will call the clinic if her symptoms worsen.    General Counseling: ross hefferan understanding of the findings of todays visit and agrees with plan of treatment. I have discussed any further diagnostic evaluation that may be needed or ordered today. We also reviewed her medications today. she has been encouraged to call the office with any questions or concerns that should arise related to todays visit.    No orders of the defined types were placed in this encounter.   No orders of the defined types were placed in this encounter.   Return for CPE, in september 2024 with Haidyn Chadderdon pcp, as needed otherwise.   Total time spent:20 Minutes Time spent includes review of chart, medications, test results, and follow up plan with the patient.   [place] Controlled Substance Database was reviewed by me.  This patient was seen by October 2024, FNP-C in collaboration with Dr. Sallyanne Kuster as a part of collaborative care agreement.   [name], MSN, FNP-C Internal medicine

## 2022-08-06 ENCOUNTER — Other Ambulatory Visit: Payer: Self-pay | Admitting: Nurse Practitioner

## 2022-08-12 DIAGNOSIS — F330 Major depressive disorder, recurrent, mild: Secondary | ICD-10-CM | POA: Diagnosis not present

## 2022-08-12 DIAGNOSIS — F411 Generalized anxiety disorder: Secondary | ICD-10-CM | POA: Diagnosis not present

## 2022-08-12 NOTE — Discharge Instructions (Signed)
[facility] ENDOSCOPIC SINUS SURGERY [facility]  What is Functional Endoscopic Sinus Surgery?  The Surgery involves making the natural openings of the sinuses larger by removing the bony partitions that separate the sinuses from the nasal cavity.  The natural sinus lining is preserved as much as possible to allow the sinuses to resume normal function after the surgery.  In some patients nasal polyps (excessively swollen lining of the sinuses) may be removed to relieve obstruction of the sinus openings.  The surgery is performed through the nose using lighted scopes, which eliminates the need for incisions on the face.  A septoplasty is a different procedure which is sometimes performed with sinus surgery.  It involves straightening the boy partition that separates the two sides of your nose.  A crooked or deviated septum may need repair if is obstructing the sinuses or nasal airflow.  Turbinate reduction is also often performed during sinus surgery.  The turbinates are bony proturberances from the side walls of the nose which swell and can obstruct the nose in patients with sinus and allergy problems.  Their size can be surgically reduced to help relieve nasal obstruction.  What Can Sinus Surgery Do For Me?  Sinus surgery can reduce the frequency of sinus infections requiring antibiotic treatment.  This can provide improvement in nasal congestion, post-nasal drainage, facial pressure and nasal obstruction.  Surgery will NOT prevent you from ever having an infection again, so it usually only for patients who get infections 4 or more times yearly requiring antibiotics, or for infections that do not clear with antibiotics.  It will not cure nasal allergies, so patients with allergies may still require medication to treat their allergies after surgery. Surgery may improve headaches related to sinusitis, however, some people will continue to  require medication to control sinus headaches related to allergies.  Surgery will do nothing for other forms of headache (migraine, tension or cluster).  What Are the Risks of Endoscopic Sinus Surgery?  Current techniques allow surgery to be performed safely with little risk, however, there are rare complications that patients should be aware of.  Because the sinuses are located around the eyes, there is risk of eye injury, including blindness, though again, this would be quite rare. This is usually a result of bleeding behind the eye during surgery, which can effect vision, though there are treatments to protect the vision and prevent permanent injury. More serious complications would include bleeding inside the brain cavity or damage to the brain.This happens when the fluid around the brain leaks out into the sinus cavity.  Again, all of these complications are uncommon, and spinal fluid leaks can be safely managed surgically if they occur.  The most common complication of sinus surgery is bleeding from the nose, which may require packing or cauterization of the nose.  Patients with polyps may experience recurrence of the polyps that would require revision surgery.  Alterations of sense of smell or injury to the tear ducts are also rare complications.   What is the Surgery Like, and what is the Recovery?  The Surgery usually takes a couple of hours to perform, and is usually performed under a general anesthetic (completely asleep).  Patients are usually discharged home after a couple of hours.  Sometimes during surgery it is necessary to pack the nose to control bleeding, and the packing is left in place for 24 - 48 hours, and removed by your surgeon.  If   a septoplasty was performed during the procedure, there is often a splint placed which must be removed after 5-7 days.   Discomfort: Pain is usually mild to moderate, and can be controlled by prescription pain medication or acetaminophen (Tylenol).   Aspirin, Ibuprofen (Advil, Motrin), or Naprosyn (Aleve) should be avoided, as they can cause increased bleeding.  Most patients feel sinus pressure like they have a bad head cold for several days.  Sleeping with your head elevated can help reduce swelling and facial pressure, as can ice packs over the face.  A humidifier may be helpful to keep the mucous and blood from drying in the nose.   Diet: There are no specific diet restrictions, however, you should generally start with clear liquids and a light diet of bland foods because the anesthetic can cause some nausea.  Advance your diet depending on how your stomach feels.  Taking your pain medication with food will often help reduce stomach upset which pain medications can cause.  Nasal Saline Irrigation: It is important to remove blood clots and dried mucous from the nose as it is healing.  This is done by having you irrigate the nose at least 3 - 4 times daily with a salt water solution.  We recommend using NeilMed Sinus Rinse (available at the drug store).  Fill the squeeze bottle with the solution, bend over a sink, and insert the tip of the squeeze bottle into the nose  of an inch.  Point the tip of the squeeze bottle towards the inside corner of the eye on the same side your irrigating.  Squeeze the bottle and gently irrigate the nose.  If you bend forward as you do this, most of the fluid will flow back out of the nose, instead of down your throat.   The solution should be warm, near body temperature, when you irrigate.   Each time you irrigate, you should use a full squeeze bottle.   Note that if you are instructed to use Nasal Steroid Sprays at any time after your surgery, irrigate with saline BEFORE using the steroid spray, so you do not wash it all out of the nose. Another product, Nasal Saline Gel (such as AYR Nasal Saline Gel) can be applied in each nostril 3 - 4 times daily to moisture the nose and reduce scabbing or crusting.  Bleeding:   Bloody drainage from the nose can be expected for several days, and patients are instructed to irrigate their nose frequently with salt water to help remove mucous and blood clots.  The drainage may be dark red or brown, though some fresh blood may be seen intermittently, especially after irrigation.  Do not blow you nose, as bleeding may occur. If you must sneeze, keep your mouth open to allow air to escape through your mouth.  If heavy bleeding occurs: Irrigate the nose with saline to rinse out clots, then spray the nose 3 - 4 times with Afrin Nasal Decongestant Spray.  The spray will constrict the blood vessels to slow bleeding.  Pinch the lower half of your nose shut to apply pressure, and lay down with your head elevated.  Ice packs over the nose may help as well. If bleeding persists despite these measures, you should notify your doctor.  Do not use the Afrin routinely to control nasal congestion after surgery, as it can result in worsening congestion and may affect healing.     Activity: Return to work varies among patients. Most patients will be out   of work at least 5 - 7 days to recover.  Patient may return to work after they are off of narcotic pain medication, and feeling well enough to perform the functions of their job.  Patients must avoid heavy lifting (over 10 pounds) or strenuous physical for 2 weeks after surgery, so your employer may need to assign you to light duty, or keep you out of work longer if light duty is not possible.  NOTE: you should not drive, operate dangerous machinery, do any mentally demanding tasks or make any important legal or financial decisions while on narcotic pain medication and recovering from the general anesthetic.    Call Your Doctor Immediately if You Have Any of the Following: Bleeding that you cannot control with the above measures Loss of vision, double vision, bulging of the eye or black eyes. Fever over 101 degrees Neck stiffness with severe headache,  fever, nausea and change in mental state. You are always encouraged to call anytime with concerns, however, please call with requests for pain medication refills during office hours.  Office Endoscopy: During follow-up visits your doctor will remove any packing or splints that may have been placed and evaluate and clean your sinuses endoscopically.  Topical anesthetic will be used to make this as comfortable as possible, though you may want to take your pain medication prior to the visit.  How often this will need to be done varies from patient to patient.  After complete recovery from the surgery, you may need follow-up endoscopy from time to time, particularly if there is concern of recurrent infection or nasal polyps.  

## 2022-08-14 ENCOUNTER — Encounter: Payer: Self-pay | Admitting: Otolaryngology

## 2022-08-14 ENCOUNTER — Ambulatory Visit: Payer: BC Managed Care – PPO | Admitting: Anesthesiology

## 2022-08-14 ENCOUNTER — Encounter
Admission: RE | Disposition: A | Discharge status: Home / Self Care | Payer: Self-pay | Source: Home / Self Care | Attending: Otolaryngology

## 2022-08-14 ENCOUNTER — Other Ambulatory Visit: Payer: Self-pay

## 2022-08-14 ENCOUNTER — Ambulatory Visit
Admission: RE | Admit: 2022-08-14 | Discharge: 2022-08-14 | Disposition: A | Discharge status: Home / Self Care | Payer: BC Managed Care – PPO | Attending: Otolaryngology | Admitting: Otolaryngology

## 2022-08-14 DIAGNOSIS — D759 Disease of blood and blood-forming organs, unspecified: Secondary | ICD-10-CM | POA: Diagnosis not present

## 2022-08-14 DIAGNOSIS — D649 Anemia, unspecified: Secondary | ICD-10-CM | POA: Diagnosis not present

## 2022-08-14 DIAGNOSIS — J342 Deviated nasal septum: Secondary | ICD-10-CM | POA: Diagnosis not present

## 2022-08-14 DIAGNOSIS — J343 Hypertrophy of nasal turbinates: Secondary | ICD-10-CM | POA: Diagnosis not present

## 2022-08-14 HISTORY — PX: SEPTOPLASTY: SHX2393

## 2022-08-14 HISTORY — PX: TURBINATE REDUCTION: SHX6157

## 2022-08-14 LAB — POCT PREGNANCY, URINE: Preg Test, Ur: NEGATIVE

## 2022-08-14 SURGERY — SEPTOPLASTY, NOSE
Anesthesia: General | Site: Nose

## 2022-08-14 MED ORDER — ONDANSETRON HCL 4 MG/2ML IJ SOLN
INTRAMUSCULAR | Status: DC | PRN
  Administered 2022-08-14: 4 mg via INTRAVENOUS

## 2022-08-14 MED ORDER — DEXAMETHASONE SODIUM PHOSPHATE 4 MG/ML IJ SOLN
INTRAMUSCULAR | Status: DC | PRN
  Administered 2022-08-14: 10 mg via INTRAVENOUS

## 2022-08-14 MED ORDER — DEXMEDETOMIDINE HCL IN NACL 80 MCG/20ML IV SOLN
INTRAVENOUS | Status: DC | PRN
  Administered 2022-08-14: 8 ug via BUCCAL
  Administered 2022-08-14: 4 ug via BUCCAL

## 2022-08-14 MED ORDER — HEMOSTATIC AGENTS (NO CHARGE) OPTIME
TOPICAL | Status: DC | PRN
  Administered 2022-08-14: 1 via TOPICAL

## 2022-08-14 MED ORDER — ACETAMINOPHEN 160 MG/5ML PO SOLN: 960.0000 mg | Freq: Once | ORAL | Status: AC

## 2022-08-14 MED ORDER — LIDOCAINE-EPINEPHRINE 1 %-1:100000 IJ SOLN
INTRAMUSCULAR | Status: DC | PRN
  Administered 2022-08-14: 7 mL

## 2022-08-14 MED ORDER — PROPOFOL 10 MG/ML IV BOLUS
INTRAVENOUS | Status: DC | PRN
  Administered 2022-08-14: 50 mg via INTRAVENOUS
  Administered 2022-08-14: 150 mg via INTRAVENOUS

## 2022-08-14 MED ORDER — OXYCODONE HCL 5 MG PO TABS: 5.0000 mg | ORAL_TABLET | Freq: Once | ORAL | Status: DC | PRN

## 2022-08-14 MED ORDER — FENTANYL CITRATE (PF) 100 MCG/2ML IJ SOLN
INTRAMUSCULAR | Status: DC | PRN
  Administered 2022-08-14 (×2): 50 ug via INTRAVENOUS

## 2022-08-14 MED ORDER — LIDOCAINE HCL (CARDIAC) PF 100 MG/5ML IV SOSY
PREFILLED_SYRINGE | INTRAVENOUS | Status: DC | PRN
  Administered 2022-08-14: 50 mg via INTRAVENOUS

## 2022-08-14 MED ORDER — OXYMETAZOLINE HCL 0.05 % NA SOLN
NASAL | Status: DC | PRN
  Administered 2022-08-14: 1 via TOPICAL

## 2022-08-14 MED ORDER — OXYCODONE HCL 5 MG PO TABS: 5.0000 mg | ORAL_TABLET | ORAL | 0 refills | Status: DC | PRN

## 2022-08-14 MED ORDER — OXYCODONE HCL 5 MG/5ML PO SOLN: 5.0000 mg | Freq: Once | ORAL | Status: DC | PRN

## 2022-08-14 MED ORDER — PHENYLEPHRINE HCL (PRESSORS) 10 MG/ML IV SOLN
INTRAVENOUS | Status: DC | PRN
  Administered 2022-08-14 (×4): 100 ug via INTRAVENOUS

## 2022-08-14 MED ORDER — LIDOCAINE HCL 4 % EX SOLN
CUTANEOUS | Status: DC | PRN
  Administered 2022-08-14: 2 mL via TOPICAL

## 2022-08-14 MED ORDER — HYDROMORPHONE HCL 1 MG/ML IJ SOLN: 0.2500 mg | INTRAMUSCULAR | Status: DC | PRN

## 2022-08-14 MED ORDER — OXYCODONE-ACETAMINOPHEN 5-325 MG PO TABS: 1.0000 | ORAL_TABLET | Freq: Four times a day (QID) | ORAL | 0 refills | Status: DC | PRN

## 2022-08-14 MED ORDER — SULFAMETHOXAZOLE-TRIMETHOPRIM 800-160 MG PO TABS: 1.0000 | ORAL_TABLET | Freq: Two times a day (BID) | ORAL | 0 refills | Status: DC

## 2022-08-14 MED ORDER — SUCCINYLCHOLINE CHLORIDE 200 MG/10ML IV SOSY
PREFILLED_SYRINGE | INTRAVENOUS | Status: DC | PRN
  Administered 2022-08-14: 100 mg via INTRAVENOUS

## 2022-08-14 MED ORDER — MIDAZOLAM HCL 5 MG/5ML IJ SOLN
INTRAMUSCULAR | Status: DC | PRN
  Administered 2022-08-14: 2 mg via INTRAVENOUS

## 2022-08-14 MED ORDER — ONDANSETRON HCL 4 MG PO TABS: 4.0000 mg | ORAL_TABLET | Freq: Three times a day (TID) | ORAL | 0 refills | Status: DC | PRN

## 2022-08-14 MED ORDER — ACETAMINOPHEN 500 MG PO TABS
1000.0000 mg | ORAL_TABLET | Freq: Once | ORAL | Status: AC
  Administered 2022-08-14: 1000 mg via ORAL

## 2022-08-14 MED ORDER — LACTATED RINGERS IV SOLN: INTRAVENOUS | Status: DC

## 2022-08-14 SURGICAL SUPPLY — 24 items
CANISTER SUCT 1200ML W/VALVE (MISCELLANEOUS) ×2 IMPLANT
DRESSING NASL FOAM PST OP SINU (MISCELLANEOUS) IMPLANT
DRSG NASAL FOAM POST OP SINU (MISCELLANEOUS) ×2
ELECT REM PT RETURN 9FT ADLT (ELECTROSURGICAL) ×2
ELECTRODE REM PT RTRN 9FT ADLT (ELECTROSURGICAL) ×2 IMPLANT
GLOVE SURG GAMMEX PI TX LF 7.5 (GLOVE) ×4 IMPLANT
GOWN STRL REUS W/ TWL LRG LVL3 (GOWN DISPOSABLE) ×2 IMPLANT
GOWN STRL REUS W/TWL LRG LVL3 (GOWN DISPOSABLE) ×4
KIT TURNOVER KIT A (KITS) ×2 IMPLANT
NDL HYPO 25GX1X1/2 BEV (NEEDLE) ×2 IMPLANT
NEEDLE HYPO 25GX1X1/2 BEV (NEEDLE) ×2 IMPLANT
PACK ENT CUSTOM (PACKS) ×2 IMPLANT
PATTIES SURGICAL .5 X3 (DISPOSABLE) ×2 IMPLANT
SOL ANTI-FOG 6CC FOG-OUT (MISCELLANEOUS) ×2 IMPLANT
SOL FOG-OUT ANTI-FOG 6CC (MISCELLANEOUS) ×2
SPLINT NASAL SEPTAL BLV .50 ST (MISCELLANEOUS) ×2 IMPLANT
STRAP BODY AND KNEE 60X3 (MISCELLANEOUS) ×2 IMPLANT
SUCTION COAG ELEC 10 HAND CTRL (ELECTROSURGICAL) IMPLANT
SUT CHROMIC 4 0 RB 1X27 (SUTURE) ×2 IMPLANT
SUT ETHILON 3-0 FS-10 30 BLK (SUTURE) ×2
SUTURE EHLN 3-0 FS-10 30 BLK (SUTURE) ×2 IMPLANT
SYR 10ML LL (SYRINGE) ×2 IMPLANT
TOWEL OR 17X26 4PK STRL BLUE (TOWEL DISPOSABLE) ×2 IMPLANT
WATER STERILE IRR 250ML POUR (IV SOLUTION) IMPLANT

## 2022-08-14 NOTE — Anesthesia Procedure Notes (Signed)
Procedure Name: Intubation Date/Time: 08/14/2022 7:49 AM  Performed by: Londell Moh, CRNAPre-anesthesia Checklist: Patient identified, Emergency Drugs available, Suction available, Patient being monitored and Timeout performed Patient Re-evaluated:Patient Re-evaluated prior to induction Oxygen Delivery Method: Circle system utilized Preoxygenation: Pre-oxygenation with 100% oxygen Induction Type: IV induction Ventilation: Mask ventilation without difficulty Laryngoscope Size: Mac and 3 Grade View: Grade I Tube type: Oral Rae Tube size: 7.0 mm Number of attempts: 1 Placement Confirmation: ETT inserted through vocal cords under direct vision, positive ETCO2 and breath sounds checked- equal and bilateral Tube secured with: Tape Dental Injury: Teeth and Oropharynx as per pre-operative assessment

## 2022-08-14 NOTE — Anesthesia Preprocedure Evaluation (Addendum)
Anesthesia Evaluation  Patient identified by MRN, date of birth, ID band Patient awake    Reviewed: Allergy & Precautions, NPO status , Patient's Chart, lab work & pertinent test results  History of Anesthesia Complications Negative for: history of anesthetic complications  Airway Mallampati: II  TM Distance: <3 FB Neck ROM: full    Dental  (+) Teeth Intact   Pulmonary neg pulmonary ROS,    Pulmonary exam normal breath sounds clear to auscultation       Cardiovascular Exercise Tolerance: Good negative cardio ROS Normal cardiovascular exam Rhythm:Regular Rate:Normal     Neuro/Psych  Headaches, negative psych ROS   GI/Hepatic negative GI ROS, Neg liver ROS,   Endo/Other  negative endocrine ROS  Renal/GU      Musculoskeletal   Abdominal   Peds  Hematology  (+) Blood dyscrasia, anemia ,   Anesthesia Other Findings Past Medical History: No date: Allergies No date: B12 deficiency No date: Iron (Fe) deficiency anemia No date: Migraines No date: Vitamin D deficiency  Past Surgical History: No date: corrective eye surgery No date: toncillectomy  BMI    Body Mass Index: 35.24 kg/m      Reproductive/Obstetrics negative OB ROS                           Anesthesia Physical Anesthesia Plan  ASA: 2  Anesthesia Plan: General ETT   Post-op Pain Management: Tylenol PO (pre-op), Toradol IV (intra-op) and Dilaudid IV   Induction: Intravenous  PONV Risk Score and Plan: 4 or greater and Ondansetron, Dexamethasone, Midazolam and Treatment may vary due to age or medical condition  Airway Management Planned: Oral ETT  Additional Equipment:   Intra-op Plan:   Post-operative Plan: Extubation in OR  Informed Consent: I have reviewed the patients History and Physical, chart, labs and discussed the procedure including the risks, benefits and alternatives for the proposed anesthesia with the  patient or authorized representative who has indicated his/her understanding and acceptance.     Dental Advisory Given  Plan Discussed with: Anesthesiologist, CRNA and Surgeon  Anesthesia Plan Comments: (Patient consented for risks of anesthesia including but not limited to:  - adverse reactions to medications - damage to eyes, teeth, lips or other oral mucosa - nerve damage due to positioning  - sore throat or hoarseness - Damage to heart, brain, nerves, lungs, other parts of body or loss of life  Patient voiced understanding.)       Anesthesia Quick Evaluation

## 2022-08-14 NOTE — H&P (Signed)
..  History and Physical paper copy reviewed and updated date of procedure and will be scanned into system.  Patient seen and examined.  

## 2022-08-14 NOTE — Op Note (Signed)
..08/14/2022  8:45 AM    Claudia Melendez  [id number]    Pre-Op Dx:  Deviated Nasal Septum, Hypertrophic Inferior Turbinates  Post-op Dx: Same  Proc: 1)  Nasal Septoplasty           2)  Bilateral Partial Reduction Inferior Turbinates   Surg:  [facility]  Anes:  GOT  EBL:  29ml  Comp:  none  Findings:  bilateral soft tissue and bone inferior turbinate hypertrophy, severe left sided septal deviation of cartilage and bone with impaction onto inferior turbinate  Procedure: With the patient in a comfortable supine position,  general orotracheal anesthesia was induced without difficulty.  The patient received preoperative Afrin spray for topical decongestion and vasoconstriction.  At an appropriate level, the patient was placed in a semi-sitting position.  Nasal vibrissae were trimmed.   1% Xylocaine with 1:100,000 epinephrine, 7 cc's, was infiltrated into the anterior floor of the nose, into the nasal spine region, into the membranous columella, and finally into the submucoperichondrial plane of the septum on both sides.  Several minutes were allowed for this to take effect.  Cottoniod pledgetts soaked in Afrin were placed into both nasal cavities and left while the patient was prepped and draped in the standard fashion.   A proper time-out was performed.   The materials were removed from the nose and observed to be intact and correct in number.  The nose was inspected with a headlight and zero degree endoscope with the findings as described above.  A left Killian incision was sharply executed and carried down to the caudal edge of the quadrangular cartilage with a 15 blade scapel.  A mucoperichondrial flap was elelvated along the quadrangular plate back to the bony-cartilaginous junction using caudal elevator and freer elevator. The mucoperiostium was then elevated along the ethmoid plate and the vomer. An itracartilagenous incision was made using the freer elevator and a  contralateral mucoperichondiral flap was elevated using a freer elevator.  Care was taken to avoid any large rents or opposing rents in the mucoperichondrial flap.  Boney spurs of the vomer and maxillary crest were removed with Takahashi forceps.  The area of cartilagenous deviation was removed with combination of freer elevator and Takahashi forceps creating a widely patent nasal cavity as well as resolution of obstruction from the cartilagenous deviation. The mucosal flaps were placed back into their anatomic position to allow visualization of the airways. The septum now sat in the midline with an improved airway.  A 4-0 Chromic was used to close the Welcome incision as well.   The inferior turbinates were then inspected.  Under endoscopic visualization, the inferior turbinates were infractured bilaterally with a Therapist, nutritional.  A kelly clamp was attached to the anterior-inferior third of each inferior turbinate for approximately one minute.  Under endoscopic visualization, Tru-cutting forceps were used to remove the anterior-inferior third of each inferior turbinate.  Electrocautery was used to control bleeding in the area. The remaining turbinate was then outfractured to open up the airway further. There was no significant bleeding noted. The right turbinate was then trimmed and outfractured in a similar fashion.  The airways were then visualized and showed open passageways on both sides that were significantly improved compared to before surgery.       There was no signifcant bleeding. Nasal splints were applied to both sides of the septum using Xomed 0.27mm regular sized splints that were trimmed, and then held in position with a 3-0 Nylon through and through suture.  Stamberger sinufoam was placed along the cut edge of the inferior turbinates bilaterally.  The patient was turned back over to anesthesia, and awakened, extubated, and taken to the PACU in satisfactory condition.  Dispo:   PACU to  home  Plan: Ice, elevation, narcotic analgesia, steroid taper, and prophylactic antibiotics for the duration of indwelling nasal foreign bodies.  We will reevaluate the patient in the office in 7 days and remove the septal splints.  Return to work in 10 days, strenuous activities in two weeks.   Claudia Melendez 08/14/2022 8:45 AM

## 2022-08-14 NOTE — Transfer of Care (Signed)
Immediate Anesthesia Transfer of Care Note  Patient: Claudia Melendez  Procedure(s) Performed: SEPTOPLASTY (Nose) TURBINATE REDUCTION (Bilateral: Nose)  Patient Location: PACU  Anesthesia Type: General ETT  Level of Consciousness: awake, alert  and patient cooperative  Airway and Oxygen Therapy: Patient Spontanous Breathing and Patient connected to supplemental oxygen  Post-op Assessment: Post-op Vital signs reviewed, Patient's Cardiovascular Status Stable, Respiratory Function Stable, Patent Airway and No signs of Nausea or vomiting  Post-op Vital Signs: Reviewed and stable  Complications: No notable events documented.

## 2022-08-14 NOTE — Anesthesia Postprocedure Evaluation (Signed)
Anesthesia Post Note  Patient: Claudia Melendez  Procedure(s) Performed: SEPTOPLASTY (Nose) TURBINATE REDUCTION (Bilateral: Nose)  Patient location during evaluation: PACU Anesthesia Type: General Level of consciousness: awake and alert Pain management: pain level controlled Vital Signs Assessment: post-procedure vital signs reviewed and stable Respiratory status: spontaneous breathing, nonlabored ventilation, respiratory function stable and patient connected to nasal cannula oxygen Cardiovascular status: blood pressure returned to baseline and stable Postop Assessment: no apparent nausea or vomiting Anesthetic complications: no   No notable events documented.   Last Vitals:  Vitals:   08/14/22 0925 08/14/22 0930  BP:  106/60  Pulse: 85 89  Resp: 14 12  Temp:    SpO2: 97% 97%    Last Pain:  Vitals:   08/14/22 0915  TempSrc:   PainSc: 0-No pain                 Ilene Qua

## 2022-08-15 ENCOUNTER — Telehealth: Payer: Self-pay

## 2022-08-15 ENCOUNTER — Encounter: Payer: Self-pay | Admitting: Otolaryngology

## 2022-08-15 NOTE — Telephone Encounter (Signed)
Pt grandmother called need refills for maxalt as per alyssa advised her that we are unable send due to we just send and also she can discuss with dr dur to she had recent surgery

## 2022-08-26 DIAGNOSIS — F411 Generalized anxiety disorder: Secondary | ICD-10-CM | POA: Diagnosis not present

## 2022-08-26 DIAGNOSIS — F330 Major depressive disorder, recurrent, mild: Secondary | ICD-10-CM | POA: Diagnosis not present

## 2022-08-27 ENCOUNTER — Encounter: Payer: Self-pay | Admitting: Nurse Practitioner

## 2022-09-13 DIAGNOSIS — F411 Generalized anxiety disorder: Secondary | ICD-10-CM | POA: Diagnosis not present

## 2022-09-13 DIAGNOSIS — F330 Major depressive disorder, recurrent, mild: Secondary | ICD-10-CM | POA: Diagnosis not present

## 2022-09-20 DIAGNOSIS — F330 Major depressive disorder, recurrent, mild: Secondary | ICD-10-CM | POA: Diagnosis not present

## 2022-09-20 DIAGNOSIS — F411 Generalized anxiety disorder: Secondary | ICD-10-CM | POA: Diagnosis not present

## 2022-09-24 DIAGNOSIS — F330 Major depressive disorder, recurrent, mild: Secondary | ICD-10-CM | POA: Diagnosis not present

## 2022-09-24 DIAGNOSIS — F411 Generalized anxiety disorder: Secondary | ICD-10-CM | POA: Diagnosis not present

## 2022-10-01 DIAGNOSIS — F411 Generalized anxiety disorder: Secondary | ICD-10-CM | POA: Diagnosis not present

## 2022-10-01 DIAGNOSIS — F330 Major depressive disorder, recurrent, mild: Secondary | ICD-10-CM | POA: Diagnosis not present

## 2022-10-08 DIAGNOSIS — F411 Generalized anxiety disorder: Secondary | ICD-10-CM | POA: Diagnosis not present

## 2022-10-08 DIAGNOSIS — F330 Major depressive disorder, recurrent, mild: Secondary | ICD-10-CM | POA: Diagnosis not present

## 2022-10-12 ENCOUNTER — Other Ambulatory Visit: Payer: Self-pay | Admitting: Nurse Practitioner

## 2022-10-25 DIAGNOSIS — F411 Generalized anxiety disorder: Secondary | ICD-10-CM | POA: Diagnosis not present

## 2022-10-25 DIAGNOSIS — F330 Major depressive disorder, recurrent, mild: Secondary | ICD-10-CM | POA: Diagnosis not present

## 2022-10-29 DIAGNOSIS — F411 Generalized anxiety disorder: Secondary | ICD-10-CM | POA: Diagnosis not present

## 2022-10-29 DIAGNOSIS — F330 Major depressive disorder, recurrent, mild: Secondary | ICD-10-CM | POA: Diagnosis not present

## 2022-11-09 ENCOUNTER — Other Ambulatory Visit: Payer: Self-pay | Admitting: Nurse Practitioner

## 2022-11-12 DIAGNOSIS — F411 Generalized anxiety disorder: Secondary | ICD-10-CM | POA: Diagnosis not present

## 2022-11-12 DIAGNOSIS — F330 Major depressive disorder, recurrent, mild: Secondary | ICD-10-CM | POA: Diagnosis not present

## 2022-11-19 DIAGNOSIS — F330 Major depressive disorder, recurrent, mild: Secondary | ICD-10-CM | POA: Diagnosis not present

## 2022-11-19 DIAGNOSIS — F411 Generalized anxiety disorder: Secondary | ICD-10-CM | POA: Diagnosis not present

## 2022-11-28 ENCOUNTER — Telehealth: Payer: BC Managed Care – PPO | Admitting: Nurse Practitioner

## 2022-11-28 ENCOUNTER — Encounter: Payer: Self-pay | Admitting: Nurse Practitioner

## 2022-11-28 VITALS — Resp 16 | Ht 67.0 in | Wt 220.0 lb

## 2022-11-28 DIAGNOSIS — R051 Acute cough: Secondary | ICD-10-CM

## 2022-11-28 DIAGNOSIS — N921 Excessive and frequent menstruation with irregular cycle: Secondary | ICD-10-CM | POA: Diagnosis not present

## 2022-11-28 DIAGNOSIS — J0111 Acute recurrent frontal sinusitis: Secondary | ICD-10-CM

## 2022-11-28 MED ORDER — HYDROCOD POLI-CHLORPHE POLI ER 10-8 MG/5ML PO SUER: 5.0000 mL | Freq: Two times a day (BID) | ORAL | 0 refills | Status: DC | PRN

## 2022-11-28 MED ORDER — AMOXICILLIN-POT CLAVULANATE 875-125 MG PO TABS: 1.0000 | ORAL_TABLET | Freq: Two times a day (BID) | ORAL | 0 refills | Status: AC

## 2022-11-28 MED ORDER — NORGESTIM-ETH ESTRAD TRIPHASIC 0.18/0.215/0.25 MG-25 MCG PO TABS: 1.0000 | ORAL_TABLET | Freq: Every day | ORAL | 1 refills | Status: DC

## 2022-11-28 NOTE — Progress Notes (Signed)
[facility], [place] [address]  Internal MEDICINE  Telephone Visit  Patient Name: Claudia Melendez  [id number]  [id number]  Date of Service: 11/28/2022  I connected with the patient at 1700 by telephone and verified the patients identity using two identifiers.   I discussed the limitations, risks, security and privacy concerns of performing an evaluation and management service by telephone and the availability of in person appointments. I also discussed with the patient that there may be a patient responsible charge related to the service.  The patient expressed understanding and agrees to proceed.    Chief Complaint  Patient presents with   Telephone Screen    Cough, congestion, covid test negative. [phone]   Telephone Assessment   Cough   Sore Throat   Headache    HPI Claudia Melendez presents for a telehealth virtual visit for cough and congestion.  -reports sore throat, headache and cough Covid test is negative.  Symptoms started on Sunday.    Current Medication: Outpatient Encounter Medications as of 11/28/2022  Medication Sig   amoxicillin-clavulanate (AUGMENTIN) 875-125 MG tablet Take 1 tablet by mouth 2 (two) times daily for 10 days. Take with food.   chlorpheniramine-HYDROcodone (TUSSIONEX) 10-8 MG/5ML Take 5 mLs by mouth every 12 (twelve) hours as needed for cough.   montelukast (SINGULAIR) 10 MG tablet TAKE 1 TABLET BY MOUTH EVERYDAY AT BEDTIME   oxymetazoline (AFRIN) 0.05 % nasal spray Use 2 sprays into affected nostril as needed for active nosebleed, may repeat x1 after 15 minutes.   pseudoephedrine (SUDAFED) 30 MG tablet Take 30 mg by mouth every 4 (four) hours as needed for congestion.   rizatriptan (MAXALT) 10 MG tablet TAKE 1 TABLET BY MOUTH AS NEEDED FOR MIGRAINE. MAY REPEAT IN 2 HOURS IF NEEDED   Tdap (ADACEL) 02-26-14.5 LF-MCG/0.5 injection Inject 0.5 mLs into the muscle once.   [DISCONTINUED] ondansetron (ZOFRAN) 4 MG tablet Take 1  tablet (4 mg total) by mouth every 8 (eight) hours as needed for nausea or vomiting.   [DISCONTINUED] oxyCODONE (OXY IR/ROXICODONE) 5 MG immediate release tablet Take 1 tablet (5 mg total) by mouth every 4 (four) hours as needed for severe pain.   [DISCONTINUED] sulfamethoxazole-trimethoprim (BACTRIM DS) 800-160 MG tablet Take 1 tablet by mouth 2 (two) times daily.   [DISCONTINUED] TRI-LO-MARZIA 0.18/0.215/0.25 MG-25 MCG tab TAKE 1 TABLET BY MOUTH EVERY DAY   Norgestimate-Ethinyl Estradiol Triphasic (TRI-LO-MARZIA) 0.18/0.215/0.25 MG-25 MCG tab Take 1 tablet by mouth daily.   No facility-administered encounter medications on file as of 11/28/2022.    Surgical History: Past Surgical History:  Procedure Laterality Date   corrective eye surgery     SEPTOPLASTY N/A 08/14/2022   Procedure: SEPTOPLASTY;  Surgeon: Carloyn Manner, MD;  Location: [facility] [place];  Service: ENT;  Laterality: N/A;   toncillectomy     TURBINATE REDUCTION Bilateral 08/14/2022   Procedure: TURBINATE REDUCTION;  Surgeon: Carloyn Manner, MD;  Location: [place];  Service: ENT;  Laterality: Bilateral;    Medical History: Past Medical History:  Diagnosis Date   Allergies    B12 deficiency    Iron (Fe) deficiency anemia    Migraines    Vitamin D deficiency     Family History: Family History  Problem Relation Age of Onset   Cancer Father        mouth   Colon cancer Paternal Grandmother     Social History   Socioeconomic History   Marital status: Single    Spouse  name: Not on file   Number of children: Not on file   Years of education: Not on file   Highest education level: Not on file  Occupational History   Not on file  Tobacco Use   Smoking status: Never   Smokeless tobacco: Never  Vaping Use   Vaping Use: Never used  Substance and Sexual Activity   Alcohol use: No   Drug use: No   Sexual activity: Not on file  Other Topics Concern   Not on file  Social History Narrative    Not on file   Social Determinants of Health   Financial Resource Strain: Not on file  Food Insecurity: Not on file  Transportation Needs: Not on file  Physical Activity: Not on file  Stress: Not on file  Social Connections: Not on file  Intimate Partner Violence: Not on file      Review of Systems  Constitutional:  Positive for fatigue. Negative for chills and fever.  HENT:  Positive for congestion, postnasal drip, rhinorrhea, sinus pressure, sinus pain and sore throat.   Respiratory:  Positive for cough.   Neurological:  Positive for headaches.    Vital Signs: Resp 16   Ht 5\' 7"  (1.702 m)   Wt 220 lb (99.8 kg)   BMI 34.46 kg/m    Observation/Objective: She is alert and oriented and engages in conversation appropriately. No acute distress noted.     Assessment/Plan: 1. Acute recurrent frontal sinusitis Empiric antibiotic tx prescribed.  - amoxicillin-clavulanate (AUGMENTIN) 875-125 MG tablet; Take 1 tablet by mouth 2 (two) times daily for 10 days. Take with food.  Dispense: 20 tablet; Refill: 0  2. Acute cough Medication prescribed for relief of cough - chlorpheniramine-HYDROcodone (TUSSIONEX) 10-8 MG/5ML; Take 5 mLs by mouth every 12 (twelve) hours as needed for cough.  Dispense: 140 mL; Refill: 0  3. Excessive and frequent menstruation with irregular cycle Refills ordered - Norgestimate-Ethinyl Estradiol Triphasic (TRI-LO-MARZIA) 0.18/0.215/0.25 MG-25 MCG tab; Take 1 tablet by mouth daily.  Dispense: 84 tablet; Refill: 1   General Counseling: Claudia Melendez verbalizes understanding of the findings of today's phone visit and agrees with plan of treatment. I have discussed any further diagnostic evaluation that may be needed or ordered today. We also reviewed her medications today. she has been encouraged to call the office with any questions or concerns that should arise related to todays visit.  Return if symptoms worsen or fail to improve.   No orders of the defined  types were placed in this encounter.   Meds ordered this encounter  Medications   chlorpheniramine-HYDROcodone (TUSSIONEX) 10-8 MG/5ML    Sig: Take 5 mLs by mouth every 12 (twelve) hours as needed for cough.    Dispense:  140 mL    Refill:  0   amoxicillin-clavulanate (AUGMENTIN) 875-125 MG tablet    Sig: Take 1 tablet by mouth 2 (two) times daily for 10 days. Take with food.    Dispense:  20 tablet    Refill:  0   Norgestimate-Ethinyl Estradiol Triphasic (TRI-LO-MARZIA) 0.18/0.215/0.25 MG-25 MCG tab    Sig: Take 1 tablet by mouth daily.    Dispense:  84 tablet    Refill:  1    Time spent:10 Minutes Time spent with patient included reviewing progress notes, labs, imaging studies, and discussing plan for follow up.  [place] Controlled Substance Database was reviewed by me for overdose risk score (ORS) if appropriate.  This patient was seen by Jonetta Osgood, FNP-C in collaboration with  Dr. Clayborn Bigness as a part of collaborative care agreement.  Sharis Keeran R. Valetta Fuller, MSN, FNP-C Internal medicine

## 2022-12-03 DIAGNOSIS — F411 Generalized anxiety disorder: Secondary | ICD-10-CM | POA: Diagnosis not present

## 2022-12-03 DIAGNOSIS — F330 Major depressive disorder, recurrent, mild: Secondary | ICD-10-CM | POA: Diagnosis not present

## 2022-12-08 ENCOUNTER — Other Ambulatory Visit: Payer: Self-pay | Admitting: Nurse Practitioner

## 2022-12-10 DIAGNOSIS — F330 Major depressive disorder, recurrent, mild: Secondary | ICD-10-CM | POA: Diagnosis not present

## 2022-12-10 DIAGNOSIS — F411 Generalized anxiety disorder: Secondary | ICD-10-CM | POA: Diagnosis not present

## 2022-12-24 DIAGNOSIS — F330 Major depressive disorder, recurrent, mild: Secondary | ICD-10-CM | POA: Diagnosis not present

## 2022-12-24 DIAGNOSIS — F411 Generalized anxiety disorder: Secondary | ICD-10-CM | POA: Diagnosis not present

## 2022-12-27 ENCOUNTER — Other Ambulatory Visit: Payer: Self-pay | Admitting: Nurse Practitioner

## 2022-12-31 DIAGNOSIS — F411 Generalized anxiety disorder: Secondary | ICD-10-CM | POA: Diagnosis not present

## 2022-12-31 DIAGNOSIS — F330 Major depressive disorder, recurrent, mild: Secondary | ICD-10-CM | POA: Diagnosis not present

## 2023-01-07 ENCOUNTER — Encounter: Payer: Self-pay | Admitting: Nurse Practitioner

## 2023-01-07 ENCOUNTER — Ambulatory Visit (INDEPENDENT_AMBULATORY_CARE_PROVIDER_SITE_OTHER): Payer: BC Managed Care – PPO | Admitting: Nurse Practitioner

## 2023-01-07 VITALS — BP 116/74 | HR 94 | Temp 98.7°F | Resp 16 | Ht 67.0 in | Wt 230.2 lb

## 2023-01-07 DIAGNOSIS — F418 Other specified anxiety disorders: Secondary | ICD-10-CM | POA: Diagnosis not present

## 2023-01-07 DIAGNOSIS — F330 Major depressive disorder, recurrent, mild: Secondary | ICD-10-CM | POA: Diagnosis not present

## 2023-01-07 DIAGNOSIS — F411 Generalized anxiety disorder: Secondary | ICD-10-CM | POA: Diagnosis not present

## 2023-01-07 DIAGNOSIS — G43109 Migraine with aura, not intractable, without status migrainosus: Secondary | ICD-10-CM

## 2023-01-07 DIAGNOSIS — G43009 Migraine without aura, not intractable, without status migrainosus: Secondary | ICD-10-CM

## 2023-01-07 DIAGNOSIS — J301 Allergic rhinitis due to pollen: Secondary | ICD-10-CM | POA: Diagnosis not present

## 2023-01-07 MED ORDER — TOPIRAMATE 25 MG PO CPSP: 25.0000 mg | ORAL_CAPSULE | Freq: Two times a day (BID) | ORAL | 2 refills | Status: DC

## 2023-01-07 MED ORDER — BUSPIRONE HCL 5 MG PO TABS: 5.0000 mg | ORAL_TABLET | Freq: Three times a day (TID) | ORAL | 3 refills | Status: DC

## 2023-01-07 MED ORDER — UBRELVY 100 MG PO TABS: 100.0000 mg | ORAL_TABLET | Freq: Every day | ORAL | 3 refills | Status: DC | PRN

## 2023-01-07 NOTE — Progress Notes (Addendum)
[facility] [place], [place] [address]  Internal MEDICINE  Office Visit Note  Patient Name: Claudia Melendez  [id number]  [id number]  Date of Service: 01/07/2023  Chief Complaint  Patient presents with   Acute Visit    headaches    HPI Kadisha presents for a follow-up visit for migraines  Migraines -- with aura, nausea, dizziness, sensitivity to light and sound, movement makes it worse.  Tried sumatriptan and rizatriptan and OTC medications like tylenol and ibuprofen  Sinus problems were fixed with septoplasty.  Has not tried any preventive meds      Current Medication: Outpatient Encounter Medications as of 01/07/2023  Medication Sig   busPIRone (BUSPAR) 5 MG tablet Take 1 tablet (5 mg total) by mouth 3 (three) times daily.   topiramate (TOPAMAX) 25 MG capsule Take 1 capsule (25 mg total) by mouth 2 (two) times daily.   Ubrogepant (UBRELVY) 100 MG TABS Take 1 tablet (100 mg total) by mouth daily as needed (acute migraine).   chlorpheniramine-HYDROcodone (TUSSIONEX) 10-8 MG/5ML Take 5 mLs by mouth every 12 (twelve) hours as needed for cough.   montelukast (SINGULAIR) 10 MG tablet TAKE 1 TABLET BY MOUTH EVERYDAY AT BEDTIME   Norgestimate-Ethinyl Estradiol Triphasic (TRI-LO-MARZIA) 0.18/0.215/0.25 MG-25 MCG tab Take 1 tablet by mouth daily.   oxymetazoline (AFRIN) 0.05 % nasal spray Use 2 sprays into affected nostril as needed for active nosebleed, may repeat x1 after 15 minutes.   pseudoephedrine (SUDAFED) 30 MG tablet Take 30 mg by mouth every 4 (four) hours as needed for congestion.   rizatriptan (MAXALT) 10 MG tablet TAKE 1 TABLET BY MOUTH AS NEEDED FOR MIGRAINE. MAY REPEAT IN 2 HOURS IF NEEDED   Tdap (ADACEL) 02-26-14.5 LF-MCG/0.5 injection Inject 0.5 mLs into the muscle once.   No facility-administered encounter medications on file as of 01/07/2023.    Surgical History: Past Surgical History:  Procedure Laterality Date   corrective eye surgery      SEPTOPLASTY N/A 08/14/2022   Procedure: SEPTOPLASTY;  Surgeon: Carloyn Manner, MD;  Location: [facility];  Service: ENT;  Laterality: N/A;   toncillectomy     TURBINATE REDUCTION Bilateral 08/14/2022   Procedure: TURBINATE REDUCTION;  Surgeon: Carloyn Manner, MD;  Location: [facility];  Service: ENT;  Laterality: Bilateral;    Medical History: Past Medical History:  Diagnosis Date   Allergies    B12 deficiency    Iron (Fe) deficiency anemia    Migraines    Vitamin D deficiency     Family History: Family History  Problem Relation Age of Onset   Cancer Father        mouth   Colon cancer Paternal Grandmother     Social History   Socioeconomic History   Marital status: Single    Spouse name: Not on file   Number of children: Not on file   Years of education: Not on file   Highest education level: Not on file  Occupational History   Not on file  Tobacco Use   Smoking status: Never   Smokeless tobacco: Never  Vaping Use   Vaping Use: Never used  Substance and Sexual Activity   Alcohol use: No   Drug use: No   Sexual activity: Not on file  Other Topics Concern   Not on file  Social History Narrative   Not on file   Social Determinants of Health   Financial Resource Strain: Not on file  Food Insecurity: Not on file  Transportation Needs: Not on file  Physical Activity: Not on file  Stress: Not on file  Social Connections: Not on file  Intimate Partner Violence: Not on file      Review of Systems  Constitutional:  Positive for fatigue.  HENT:  Negative for congestion, nosebleeds, postnasal drip, rhinorrhea and sneezing.        Phonophobia  Eyes:  Positive for photophobia. Negative for pain.  Respiratory: Negative.  Negative for cough, chest tightness, shortness of breath and wheezing.   Cardiovascular: Negative.  Negative for chest pain and palpitations.  Gastrointestinal:  Positive for nausea.  Neurological:  Positive for  dizziness and headaches.  Psychiatric/Behavioral:  Positive for sleep disturbance. Negative for self-injury and suicidal ideas. The patient is nervous/anxious.     Vital Signs: BP 116/74   Pulse 94   Temp 98.7 F (37.1 C)   Resp 16   Ht 5\' 7"  (1.702 m)   Wt 230 lb 3.2 oz (104.4 kg)   SpO2 95%   BMI 36.05 kg/m    Physical Exam Vitals reviewed.  Constitutional:      General: She is not in acute distress.    Appearance: Normal appearance. She is obese. She is not ill-appearing.  HENT:     Head: Normocephalic and atraumatic.  Eyes:     Pupils: Pupils are equal, round, and reactive to light.  Cardiovascular:     Rate and Rhythm: Normal rate and regular rhythm.  Pulmonary:     Effort: Pulmonary effort is normal. No respiratory distress.  Neurological:     Mental Status: She is alert and oriented to person, place, and time.  Psychiatric:        Mood and Affect: Mood normal.        Behavior: Behavior normal.        Assessment/Plan: 1. Migraine with aura and without status migrainosus, not intractable Start topiramate 25 mg twice daily for migraine prevention Start ubrelvy prn for acute migraines - topiramate (TOPAMAX) 25 MG capsule; Take 1 capsule (25 mg total) by mouth 2 (two) times daily.  Dispense: 60 capsule; Refill: 2 - Ubrogepant (UBRELVY) 100 MG TABS; Take 1 tablet (100 mg total) by mouth daily as needed (acute migraine).  Dispense: 16 tablet; Refill: 3  2. Situational anxiety Stable, continue buspirone as prescribed, refill ordered.  - busPIRone (BUSPAR) 5 MG tablet; Take 1 tablet (5 mg total) by mouth 3 (three) times daily.  Dispense: 90 tablet; Refill: 3   General Counseling: Kerrian verbalizes understanding of the findings of todays visit and agrees with plan of treatment. I have discussed any further diagnostic evaluation that may be needed or ordered today. We also reviewed her medications today. she has been encouraged to call the office with any questions  or concerns that should arise related to todays visit.    No orders of the defined types were placed in this encounter.   Meds ordered this encounter  Medications   topiramate (TOPAMAX) 25 MG capsule    Sig: Take 1 capsule (25 mg total) by mouth 2 (two) times daily.    Dispense:  60 capsule    Refill:  2   Ubrogepant (UBRELVY) 100 MG TABS    Sig: Take 1 tablet (100 mg total) by mouth daily as needed (acute migraine).    Dispense:  16 tablet    Refill:  3    New prescription, fill asap, patient has copay card.   busPIRone (BUSPAR) 5 MG tablet  Sig: Take 1 tablet (5 mg total) by mouth 3 (three) times daily.    Dispense:  90 tablet    Refill:  3    Return in about 4 weeks (around 02/04/2023) for F/U, eval new med, Tysean [place] PCP.   Total time spent:20 Minutes Time spent includes review of chart, medications, test results, and follow up plan with the patient.   [place] Controlled Substance Database was reviewed by me.  This patient was seen by Jonetta Osgood, FNP-C in collaboration with Dr. Clayborn Bigness as a part of collaborative care agreement.   Jayra Choyce R. Valetta Fuller, MSN, FNP-C Internal medicine

## 2023-01-09 ENCOUNTER — Telehealth: Payer: Self-pay

## 2023-01-09 NOTE — Telephone Encounter (Signed)
Left message for patient to give office a call.

## 2023-01-17 ENCOUNTER — Telehealth: Payer: Self-pay

## 2023-01-17 NOTE — Telephone Encounter (Signed)
Completed P.A. for patient's Ubrelvy 100mg .

## 2023-01-18 ENCOUNTER — Encounter: Payer: Self-pay | Admitting: Nurse Practitioner

## 2023-01-21 DIAGNOSIS — F411 Generalized anxiety disorder: Secondary | ICD-10-CM | POA: Diagnosis not present

## 2023-01-21 DIAGNOSIS — F330 Major depressive disorder, recurrent, mild: Secondary | ICD-10-CM | POA: Diagnosis not present

## 2023-01-28 DIAGNOSIS — F411 Generalized anxiety disorder: Secondary | ICD-10-CM | POA: Diagnosis not present

## 2023-01-28 DIAGNOSIS — F330 Major depressive disorder, recurrent, mild: Secondary | ICD-10-CM | POA: Diagnosis not present

## 2023-01-29 ENCOUNTER — Other Ambulatory Visit: Payer: Self-pay | Admitting: Nurse Practitioner

## 2023-01-29 DIAGNOSIS — F418 Other specified anxiety disorders: Secondary | ICD-10-CM

## 2023-02-03 ENCOUNTER — Other Ambulatory Visit: Payer: Self-pay | Admitting: Nurse Practitioner

## 2023-02-04 ENCOUNTER — Ambulatory Visit: Payer: BC Managed Care – PPO | Admitting: Nurse Practitioner

## 2023-02-04 DIAGNOSIS — F330 Major depressive disorder, recurrent, mild: Secondary | ICD-10-CM | POA: Diagnosis not present

## 2023-02-04 DIAGNOSIS — F411 Generalized anxiety disorder: Secondary | ICD-10-CM | POA: Diagnosis not present

## 2023-02-11 ENCOUNTER — Ambulatory Visit: Payer: BC Managed Care – PPO | Admitting: Nurse Practitioner

## 2023-02-11 ENCOUNTER — Encounter: Payer: Self-pay | Admitting: Nurse Practitioner

## 2023-02-11 VITALS — BP 144/85 | HR 94 | Temp 97.9°F | Resp 16 | Ht 67.0 in | Wt 228.6 lb

## 2023-02-11 DIAGNOSIS — G43109 Migraine with aura, not intractable, without status migrainosus: Secondary | ICD-10-CM | POA: Diagnosis not present

## 2023-02-11 DIAGNOSIS — N921 Excessive and frequent menstruation with irregular cycle: Secondary | ICD-10-CM | POA: Diagnosis not present

## 2023-02-11 DIAGNOSIS — F418 Other specified anxiety disorders: Secondary | ICD-10-CM | POA: Diagnosis not present

## 2023-02-11 DIAGNOSIS — F411 Generalized anxiety disorder: Secondary | ICD-10-CM | POA: Diagnosis not present

## 2023-02-11 DIAGNOSIS — F330 Major depressive disorder, recurrent, mild: Secondary | ICD-10-CM | POA: Diagnosis not present

## 2023-02-11 MED ORDER — NORGESTIM-ETH ESTRAD TRIPHASIC 0.18/0.215/0.25 MG-25 MCG PO TABS: 1.0000 | ORAL_TABLET | Freq: Every day | ORAL | 3 refills | Status: DC

## 2023-02-11 NOTE — Progress Notes (Signed)
[facility] [address] [place], [place] [address]  Internal MEDICINE  Office Visit Note  Patient Name: Claudia Melendez  [id number]  [id number]  Date of Service: 02/11/2023  Chief Complaint  Patient presents with   Follow-up    Eval new med.     HPI Claudia Melendez presents for a follow-up visit for migraines  Migraines improving, Topiramate has decreased frequency of migraines.  Not running out of acute migraine medication anymore.  Bernita Raisin successful at stopping migraine within 2 hours for patient. Working on [place], need to redo, Due for refill of oral contraceptive medication  Current Medication: Outpatient Encounter Medications as of 02/11/2023  Medication Sig   busPIRone (BUSPAR) 5 MG tablet TAKE 1 TABLET BY MOUTH THREE TIMES A DAY   chlorpheniramine-HYDROcodone (TUSSIONEX) 10-8 MG/5ML Take 5 mLs by mouth every 12 (twelve) hours as needed for cough.   montelukast (SINGULAIR) 10 MG tablet TAKE 1 TABLET BY MOUTH EVERYDAY AT BEDTIME   oxymetazoline (AFRIN) 0.05 % nasal spray Use 2 sprays into affected nostril as needed for active nosebleed, may repeat x1 after 15 minutes.   pseudoephedrine (SUDAFED) 30 MG tablet Take 30 mg by mouth every 4 (four) hours as needed for congestion.   Tdap (ADACEL) 02-26-14.5 LF-MCG/0.5 injection Inject 0.5 mLs into the muscle once.   topiramate (TOPAMAX) 25 MG capsule Take 1 capsule (25 mg total) by mouth 2 (two) times daily.   [DISCONTINUED] Norgestimate-Ethinyl Estradiol Triphasic (TRI-LO-MARZIA) 0.18/0.215/0.25 MG-25 MCG tab Take 1 tablet by mouth daily.   [DISCONTINUED] Ubrogepant (UBRELVY) 100 MG TABS Take 1 tablet (100 mg total) by mouth daily as needed (acute migraine).   Norgestimate-Ethinyl Estradiol Triphasic (TRI-LO-MARZIA) 0.18/0.215/0.25 MG-25 MCG tab Take 1 tablet by mouth daily.   Ubrogepant (UBRELVY) 100 MG TABS Take 1 tablet (100 mg total) by mouth daily as needed (acute migraine).   No facility-administered encounter medications  on file as of 02/11/2023.    Surgical History: Past Surgical History:  Procedure Laterality Date   corrective eye surgery     SEPTOPLASTY N/A 08/14/2022   Procedure: SEPTOPLASTY;  Surgeon: Bud Face, MD;  Location: [facility];  Service: ENT;  Laterality: N/A;   toncillectomy     TURBINATE REDUCTION Bilateral 08/14/2022   Procedure: TURBINATE REDUCTION;  Surgeon: Bud Face, MD;  Location: [facility];  Service: ENT;  Laterality: Bilateral;    Medical History: Past Medical History:  Diagnosis Date   Allergies    B12 deficiency    Iron (Fe) deficiency anemia    Migraines    Vitamin D deficiency     Family History: Family History  Problem Relation Age of Onset   Cancer Father        mouth   Colon cancer Paternal Grandmother     Social History   Socioeconomic History   Marital status: Single    Spouse name: Not on file   Number of children: Not on file   Years of education: Not on file   Highest education level: Not on file  Occupational History   Not on file  Tobacco Use   Smoking status: Never   Smokeless tobacco: Never  Vaping Use   Vaping Use: Never used  Substance and Sexual Activity   Alcohol use: No   Drug use: No   Sexual activity: Not on file  Other Topics Concern   Not on file  Social History Narrative   Not on file   Social Determinants of Health   Financial Resource  Strain: Not on file  Food Insecurity: Not on file  Transportation Needs: Not on file  Physical Activity: Not on file  Stress: Not on file  Social Connections: Not on file  Intimate Partner Violence: Not on file      Review of Systems  Constitutional:  Positive for fatigue.  HENT:  Negative for congestion, nosebleeds, postnasal drip, rhinorrhea and sneezing.        Phonophobia  Eyes:  Positive for photophobia. Negative for pain.  Respiratory: Negative.  Negative for cough, chest tightness, shortness of breath and wheezing.   Cardiovascular:  Negative.  Negative for chest pain and palpitations.  Gastrointestinal:  Positive for nausea.  Neurological:  Positive for dizziness and headaches (improving).  Psychiatric/Behavioral:  Positive for sleep disturbance. Negative for self-injury and suicidal ideas. The patient is nervous/anxious.     Vital Signs: BP (!) 144/85   Pulse 94   Temp 97.9 F (36.6 C)   Resp 16   Ht 5\' 7"  (1.702 m)   Wt 228 lb 9.6 oz (103.7 kg)   SpO2 96%   BMI 35.80 kg/m    Physical Exam Vitals reviewed.  Constitutional:      General: She is not in acute distress.    Appearance: Normal appearance. She is obese. She is not ill-appearing.  HENT:     Head: Normocephalic and atraumatic.  Eyes:     Pupils: Pupils are equal, round, and reactive to light.  Cardiovascular:     Rate and Rhythm: Normal rate and regular rhythm.  Pulmonary:     Effort: Pulmonary effort is normal. No respiratory distress.  Neurological:     Mental Status: She is alert and oriented to person, place, and time.  Psychiatric:        Mood and Affect: Mood normal.        Behavior: Behavior normal.        Assessment/Plan: 1. Migraine with aura and without status migrainosus, not intractable Ubrelvy reordered to redo the prior authorization. Continue topiramate as prescribed.  - Ubrogepant (UBRELVY) 100 MG TABS; Take 1 tablet (100 mg total) by mouth daily as needed (acute migraine).  Dispense: 16 tablet; Refill: 3  2. Excessive and frequent menstruation with irregular cycle Refills ordered, continue medication as prescribed - Norgestimate-Ethinyl Estradiol Triphasic (TRI-LO-MARZIA) 0.18/0.215/0.25 MG-25 MCG tab; Take 1 tablet by mouth daily.  Dispense: 84 tablet; Refill: 3  3. Situational anxiety Improving, has a new job that she enjoys, doing well   General Counseling: Claudia Melendez verbalizes understanding of the findings of todays visit and agrees with plan of treatment. I have discussed any further diagnostic evaluation that  may be needed or ordered today. We also reviewed her medications today. she has been encouraged to call the office with any questions or concerns that should arise related to todays visit.    No orders of the defined types were placed in this encounter.   Meds ordered this encounter  Medications   Norgestimate-Ethinyl Estradiol Triphasic (TRI-LO-MARZIA) 0.18/0.215/0.25 MG-25 MCG tab    Sig: Take 1 tablet by mouth daily.    Dispense:  84 tablet    Refill:  3   Ubrogepant (UBRELVY) 100 MG TABS    Sig: Take 1 tablet (100 mg total) by mouth daily as needed (acute migraine).    Dispense:  16 tablet    Refill:  3    New prescription, fill asap, patient has copay card. Resent prior auth, need to redo with new insurance please  Return if symptoms worsen or fail to improve.   Total time spent:30 Minutes Time spent includes review of chart, medications, test results, and follow up plan with the patient.   [place] Controlled Substance Database was reviewed by me.  This patient was seen by Sallyanne Kuster, FNP-C in collaboration with Dr. Beverely Risen as a part of collaborative care agreement.   Arisbeth Purrington R. Tedd Sias, MSN, FNP-C Internal medicine

## 2023-02-17 ENCOUNTER — Other Ambulatory Visit: Payer: Self-pay | Admitting: Nurse Practitioner

## 2023-02-22 MED ORDER — UBRELVY 100 MG PO TABS: 100.0000 mg | ORAL_TABLET | Freq: Every day | ORAL | 3 refills | Status: DC | PRN

## 2023-02-25 DIAGNOSIS — F330 Major depressive disorder, recurrent, mild: Secondary | ICD-10-CM | POA: Diagnosis not present

## 2023-02-25 DIAGNOSIS — F411 Generalized anxiety disorder: Secondary | ICD-10-CM | POA: Diagnosis not present

## 2023-03-04 ENCOUNTER — Telehealth: Payer: Self-pay

## 2023-03-04 NOTE — Telephone Encounter (Signed)
Completed P.A. for patient's Ubrelvy. 

## 2023-03-06 ENCOUNTER — Telehealth: Payer: Self-pay

## 2023-03-06 DIAGNOSIS — F411 Generalized anxiety disorder: Secondary | ICD-10-CM | POA: Diagnosis not present

## 2023-03-06 DIAGNOSIS — F330 Major depressive disorder, recurrent, mild: Secondary | ICD-10-CM | POA: Diagnosis not present

## 2023-03-06 NOTE — Telephone Encounter (Signed)
Completed P.A. for patient's Ubrelvy. 

## 2023-03-12 DIAGNOSIS — F330 Major depressive disorder, recurrent, mild: Secondary | ICD-10-CM | POA: Diagnosis not present

## 2023-03-12 DIAGNOSIS — F411 Generalized anxiety disorder: Secondary | ICD-10-CM | POA: Diagnosis not present

## 2023-03-19 DIAGNOSIS — F330 Major depressive disorder, recurrent, mild: Secondary | ICD-10-CM | POA: Diagnosis not present

## 2023-03-19 DIAGNOSIS — F411 Generalized anxiety disorder: Secondary | ICD-10-CM | POA: Diagnosis not present

## 2023-03-21 ENCOUNTER — Telehealth: Payer: Self-pay

## 2023-03-21 NOTE — Telephone Encounter (Signed)
Pt grandmother called advised her she can pickup samples for [employer]

## 2023-03-27 DIAGNOSIS — F330 Major depressive disorder, recurrent, mild: Secondary | ICD-10-CM | POA: Diagnosis not present

## 2023-03-27 DIAGNOSIS — F411 Generalized anxiety disorder: Secondary | ICD-10-CM | POA: Diagnosis not present

## 2023-04-03 ENCOUNTER — Other Ambulatory Visit: Payer: Self-pay | Admitting: Nurse Practitioner

## 2023-04-03 DIAGNOSIS — F330 Major depressive disorder, recurrent, mild: Secondary | ICD-10-CM | POA: Diagnosis not present

## 2023-04-03 DIAGNOSIS — G43109 Migraine with aura, not intractable, without status migrainosus: Secondary | ICD-10-CM

## 2023-04-03 DIAGNOSIS — F411 Generalized anxiety disorder: Secondary | ICD-10-CM | POA: Diagnosis not present

## 2023-04-10 DIAGNOSIS — F411 Generalized anxiety disorder: Secondary | ICD-10-CM | POA: Diagnosis not present

## 2023-04-10 DIAGNOSIS — F330 Major depressive disorder, recurrent, mild: Secondary | ICD-10-CM | POA: Diagnosis not present

## 2023-04-17 DIAGNOSIS — F411 Generalized anxiety disorder: Secondary | ICD-10-CM | POA: Diagnosis not present

## 2023-04-17 DIAGNOSIS — F330 Major depressive disorder, recurrent, mild: Secondary | ICD-10-CM | POA: Diagnosis not present

## 2023-04-21 DIAGNOSIS — F330 Major depressive disorder, recurrent, mild: Secondary | ICD-10-CM | POA: Diagnosis not present

## 2023-04-21 DIAGNOSIS — F411 Generalized anxiety disorder: Secondary | ICD-10-CM | POA: Diagnosis not present

## 2023-05-01 DIAGNOSIS — F330 Major depressive disorder, recurrent, mild: Secondary | ICD-10-CM | POA: Diagnosis not present

## 2023-05-01 DIAGNOSIS — F411 Generalized anxiety disorder: Secondary | ICD-10-CM | POA: Diagnosis not present

## 2023-05-08 DIAGNOSIS — F411 Generalized anxiety disorder: Secondary | ICD-10-CM | POA: Diagnosis not present

## 2023-05-08 DIAGNOSIS — F330 Major depressive disorder, recurrent, mild: Secondary | ICD-10-CM | POA: Diagnosis not present

## 2023-05-16 ENCOUNTER — Other Ambulatory Visit: Payer: Self-pay | Admitting: Physician Assistant

## 2023-05-16 DIAGNOSIS — G43109 Migraine with aura, not intractable, without status migrainosus: Secondary | ICD-10-CM

## 2023-05-16 MED ORDER — ZOLMITRIPTAN 2.5 MG PO TABS: 2.5000 mg | ORAL_TABLET | ORAL | 2 refills | Status: DC | PRN

## 2023-05-22 DIAGNOSIS — F330 Major depressive disorder, recurrent, mild: Secondary | ICD-10-CM | POA: Diagnosis not present

## 2023-05-22 DIAGNOSIS — F411 Generalized anxiety disorder: Secondary | ICD-10-CM | POA: Diagnosis not present

## 2023-06-04 DIAGNOSIS — F411 Generalized anxiety disorder: Secondary | ICD-10-CM | POA: Diagnosis not present

## 2023-06-04 DIAGNOSIS — F330 Major depressive disorder, recurrent, mild: Secondary | ICD-10-CM | POA: Diagnosis not present

## 2023-06-12 DIAGNOSIS — F330 Major depressive disorder, recurrent, mild: Secondary | ICD-10-CM | POA: Diagnosis not present

## 2023-06-12 DIAGNOSIS — F411 Generalized anxiety disorder: Secondary | ICD-10-CM | POA: Diagnosis not present

## 2023-06-26 DIAGNOSIS — F411 Generalized anxiety disorder: Secondary | ICD-10-CM | POA: Diagnosis not present

## 2023-06-26 DIAGNOSIS — F330 Major depressive disorder, recurrent, mild: Secondary | ICD-10-CM | POA: Diagnosis not present

## 2023-07-02 ENCOUNTER — Encounter: Payer: BC Managed Care – PPO | Admitting: Nurse Practitioner

## 2023-07-03 ENCOUNTER — Ambulatory Visit (INDEPENDENT_AMBULATORY_CARE_PROVIDER_SITE_OTHER): Payer: BC Managed Care – PPO | Admitting: Nurse Practitioner

## 2023-07-03 ENCOUNTER — Encounter: Payer: Self-pay | Admitting: Nurse Practitioner

## 2023-07-03 VITALS — BP 126/88 | HR 86 | Temp 98.4°F | Resp 16 | Ht 67.0 in | Wt 210.2 lb

## 2023-07-03 DIAGNOSIS — F418 Other specified anxiety disorders: Secondary | ICD-10-CM

## 2023-07-03 DIAGNOSIS — Z0001 Encounter for general adult medical examination with abnormal findings: Secondary | ICD-10-CM

## 2023-07-03 DIAGNOSIS — G43109 Migraine with aura, not intractable, without status migrainosus: Secondary | ICD-10-CM | POA: Diagnosis not present

## 2023-07-03 DIAGNOSIS — E782 Mixed hyperlipidemia: Secondary | ICD-10-CM

## 2023-07-03 DIAGNOSIS — F330 Major depressive disorder, recurrent, mild: Secondary | ICD-10-CM | POA: Diagnosis not present

## 2023-07-03 DIAGNOSIS — R300 Dysuria: Secondary | ICD-10-CM

## 2023-07-03 DIAGNOSIS — F411 Generalized anxiety disorder: Secondary | ICD-10-CM | POA: Diagnosis not present

## 2023-07-03 MED ORDER — TOPIRAMATE 50 MG PO TABS: 50.0000 mg | ORAL_TABLET | Freq: Two times a day (BID) | ORAL | 5 refills | Status: DC

## 2023-07-03 MED ORDER — BUSPIRONE HCL 5 MG PO TABS: 5.0000 mg | ORAL_TABLET | Freq: Three times a day (TID) | ORAL | 3 refills | Status: DC

## 2023-07-03 NOTE — Progress Notes (Signed)
[facility] [address] [place], [place] [address]  Internal MEDICINE  Office Visit Note  Patient Name: Claudia Melendez  [id number]  [id number]  Date of Service: 07/03/2023  Chief Complaint  Patient presents with   Annual Exam    HPI Minyon presents for an annual well visit and physical exam.  Well-appearing 27 y.o. female with migraines, anxiety, and history of depression Pap smear: declined, not sexually, wants to postpone for now.  Labs: due for routine labs  New or worsening pain: none Other concerns: Still having some issues with her migraines, on topiramate and zolmitriptan.    Current Medication: Outpatient Encounter Medications as of 07/03/2023  Medication Sig   Norgestimate-Ethinyl Estradiol Triphasic (TRI-LO-MARZIA) 0.18/0.215/0.25 MG-25 MCG tab Take 1 tablet by mouth daily.   oxymetazoline (AFRIN) 0.05 % nasal spray Use 2 sprays into affected nostril as needed for active nosebleed, may repeat x1 after 15 minutes.   pseudoephedrine (SUDAFED) 30 MG tablet Take 30 mg by mouth every 4 (four) hours as needed for congestion.   Tdap (ADACEL) 02-26-14.5 LF-MCG/0.5 injection Inject 0.5 mLs into the muscle once.   topiramate (TOPAMAX) 50 MG tablet Take 1 tablet (50 mg total) by mouth 2 (two) times daily.   ZOLMitriptan (ZOMIG) 2.5 MG tablet Take 1 tablet (2.5 mg total) by mouth as needed for migraine or headache. May repeat in 2 hours if headache persists or recurs.   [DISCONTINUED] busPIRone (BUSPAR) 5 MG tablet TAKE 1 TABLET BY MOUTH THREE TIMES A DAY   [DISCONTINUED] chlorpheniramine-HYDROcodone (TUSSIONEX) 10-8 MG/5ML Take 5 mLs by mouth every 12 (twelve) hours as needed for cough.   [DISCONTINUED] montelukast (SINGULAIR) 10 MG tablet TAKE 1 TABLET BY MOUTH EVERYDAY AT BEDTIME   [DISCONTINUED] topiramate (TOPAMAX) 25 MG capsule TAKE 1 CAPSULE BY MOUTH 2 TIMES DAILY.   busPIRone (BUSPAR) 5 MG tablet Take 1 tablet (5 mg total) by mouth 3 (three) times daily.   No  facility-administered encounter medications on file as of 07/03/2023.    Surgical History: Past Surgical History:  Procedure Laterality Date   corrective eye surgery     SEPTOPLASTY N/A 08/14/2022   Procedure: SEPTOPLASTY;  Surgeon: Bud Face, MD;  Location: [facility];  Service: ENT;  Laterality: N/A;   toncillectomy     TURBINATE REDUCTION Bilateral 08/14/2022   Procedure: TURBINATE REDUCTION;  Surgeon: Bud Face, MD;  Location: [facility];  Service: ENT;  Laterality: Bilateral;    Medical History: Past Medical History:  Diagnosis Date   Allergies    B12 deficiency    Iron (Fe) deficiency anemia    Migraines    Vitamin D deficiency     Family History: Family History  Problem Relation Age of Onset   Cancer Father        mouth   Colon cancer Paternal Grandmother     Social History   Socioeconomic History   Marital status: Single    Spouse name: Not on file   Number of children: Not on file   Years of education: Not on file   Highest education level: Not on file  Occupational History   Not on file  Tobacco Use   Smoking status: Never   Smokeless tobacco: Never  Vaping Use   Vaping status: Never Used  Substance and Sexual Activity   Alcohol use: No   Drug use: No   Sexual activity: Not on file  Other Topics Concern   Not on file  Social History Narrative  Not on file   Social Determinants of Health   Financial Resource Strain: Not on file  Food Insecurity: Not on file  Transportation Needs: Not on file  Physical Activity: Not on file  Stress: Not on file  Social Connections: Not on file  Intimate Partner Violence: Not on file      Review of Systems  Constitutional: Negative.  Negative for activity change, appetite change, chills, fatigue, fever and unexpected weight change.  HENT:  Positive for congestion, postnasal drip, rhinorrhea and sneezing. Negative for ear pain, nosebleeds, sore throat and trouble swallowing.    Eyes: Negative.  Negative for pain.  Respiratory: Negative.  Negative for cough, chest tightness, shortness of breath and wheezing.   Cardiovascular: Negative.  Negative for chest pain and palpitations.  Gastrointestinal: Negative.  Negative for abdominal pain, blood in stool, constipation, diarrhea, nausea and vomiting.  Endocrine: Negative.   Genitourinary: Negative.  Negative for difficulty urinating, dysuria, frequency, hematuria and urgency.  Musculoskeletal: Negative.  Negative for arthralgias, back pain, joint swelling, myalgias and neck pain.  Skin: Negative.  Negative for rash and wound.  Allergic/Immunologic: Negative.  Negative for immunocompromised state.  Neurological:  Positive for headaches. Negative for dizziness, seizures and numbness.  Hematological: Negative.   Psychiatric/Behavioral:  Negative for behavioral problems, self-injury, sleep disturbance and suicidal ideas. The patient is nervous/anxious.     Vital Signs: BP 126/88   Pulse 86   Temp 98.4 F (36.9 C)   Resp 16   Ht 5\' 7"  (1.702 m)   Wt 210 lb 3.2 oz (95.3 kg)   SpO2 98%   BMI 32.92 kg/m    Physical Exam Vitals reviewed.  Constitutional:      General: She is not in acute distress.    Appearance: Normal appearance. She is well-developed. She is obese. She is not ill-appearing or diaphoretic.  HENT:     Head: Normocephalic and atraumatic.     Right Ear: Tympanic membrane, ear canal and external ear normal.     Left Ear: Tympanic membrane, ear canal and external ear normal.     Nose: No nasal deformity, septal deviation, signs of injury, mucosal edema, congestion or rhinorrhea.     Right Nostril: No epistaxis, septal hematoma or occlusion.     Left Nostril: No epistaxis, septal hematoma or occlusion.     Right Turbinates: Not swollen or pale.     Left Turbinates: Not swollen or pale.     Right Sinus: No maxillary sinus tenderness or frontal sinus tenderness.     Left Sinus: No maxillary sinus  tenderness or frontal sinus tenderness.     Mouth/Throat:     Mouth: Mucous membranes are moist.     Pharynx: Oropharynx is clear. No oropharyngeal exudate or posterior oropharyngeal erythema.  Eyes:     General: No scleral icterus.       Right eye: No discharge.        Left eye: No discharge.     Extraocular Movements: Extraocular movements intact.     Conjunctiva/sclera: Conjunctivae normal.     Pupils: Pupils are equal, round, and reactive to light.  Neck:     Thyroid: No thyromegaly.     Vascular: No JVD.     Trachea: No tracheal deviation.  Cardiovascular:     Rate and Rhythm: Normal rate and regular rhythm.     Pulses: Normal pulses.     Heart sounds: Normal heart sounds. No murmur heard.    No friction rub. No  gallop.  Pulmonary:     Effort: Pulmonary effort is normal. No respiratory distress.     Breath sounds: Normal breath sounds. No stridor. No wheezing or rales.  Chest:     Chest wall: No mass, lacerations, deformity, swelling or tenderness.  Breasts:    Right: Normal. No swelling, inverted nipple, mass, nipple discharge, skin change or tenderness.     Left: Normal. No swelling, inverted nipple, mass, nipple discharge, skin change or tenderness.  Abdominal:     General: Bowel sounds are normal. There is no distension.     Palpations: Abdomen is soft. There is no mass.     Tenderness: There is no abdominal tenderness. There is no guarding or rebound.  Musculoskeletal:        General: No tenderness or deformity. Normal range of motion.     Cervical back: Normal range of motion and neck supple.  Lymphadenopathy:     Cervical: No cervical adenopathy.     Upper Body:     Right upper body: No supraclavicular, axillary or pectoral adenopathy.     Left upper body: No supraclavicular, axillary or pectoral adenopathy.  Skin:    General: Skin is warm and dry.     Capillary Refill: Capillary refill takes less than 2 seconds.     Coloration: Skin is not pale.     Findings:  No erythema or rash.  Neurological:     Mental Status: She is alert and oriented to person, place, and time.     Cranial Nerves: No cranial nerve deficit.     Motor: No abnormal muscle tone.     Coordination: Coordination normal.     Deep Tendon Reflexes: Reflexes are normal and symmetric.  Psychiatric:        Mood and Affect: Mood normal.        Behavior: Behavior normal.        Thought Content: Thought content normal.        Judgment: Judgment normal.        Assessment/Plan: 1. Encounter for routine adult health examination with abnormal findings Age-appropriate preventive screenings and vaccinations discussed, annual physical exam completed. Routine labs for health maintenance ordered, see below. PHM updated.  - CBC with Differential/Platelet - CMP14+EGFR - Lipid Profile  2. Migraine with aura and without status migrainosus, not intractable Topiramate dose increased to help prevent migraines better. Routine labs ordered  - topiramate (TOPAMAX) 50 MG tablet; Take 1 tablet (50 mg total) by mouth 2 (two) times daily.  Dispense: 60 tablet; Refill: 5 - CBC with Differential/Platelet - CMP14+EGFR - Lipid Profile  3. Mixed hyperlipidemia Routine labs ordered  - CBC with Differential/Platelet - CMP14+EGFR - Lipid Profile  4. Dysuria Routine urinalysis done  - UA/M w/rflx Culture, Routine - Microscopic Examination  5. Situational anxiety Stable, continue buspirone as prescribed.  - busPIRone (BUSPAR) 5 MG tablet; Take 1 tablet (5 mg total) by mouth 3 (three) times daily.  Dispense: 270 tablet; Refill: 3      General Counseling: Shakenia verbalizes understanding of the findings of todays visit and agrees with plan of treatment. I have discussed any further diagnostic evaluation that may be needed or ordered today. We also reviewed her medications today. she has been encouraged to call the office with any questions or concerns that should arise related to todays  visit.    Orders Placed This Encounter  Procedures   CBC with Differential/Platelet   CMP14+EGFR   Lipid Profile    Meds ordered  this encounter  Medications   topiramate (TOPAMAX) 50 MG tablet    Sig: Take 1 tablet (50 mg total) by mouth 2 (two) times daily.    Dispense:  60 tablet    Refill:  5   busPIRone (BUSPAR) 5 MG tablet    Sig: Take 1 tablet (5 mg total) by mouth 3 (three) times daily.    Dispense:  270 tablet    Refill:  3    Return in about 1 year (around 07/02/2024) for CPE, Violia Knopf PCP amd otherwise as needed..   Total time spent:30 Minutes Time spent includes review of chart, medications, test results, and follow up plan with the patient.   [place] Controlled Substance Database was reviewed by me.  This patient was seen by Sallyanne Kuster, FNP-C in collaboration with Dr. Beverely Risen as a part of collaborative care agreement.  Marsela Kuan R. Tedd Sias, MSN, FNP-C Internal medicine

## 2023-07-04 ENCOUNTER — Encounter: Payer: Self-pay | Admitting: Nurse Practitioner

## 2023-07-04 LAB — UA/M W/RFLX CULTURE, ROUTINE
Bilirubin, UA: NEGATIVE
Glucose, UA: NEGATIVE
Ketones, UA: NEGATIVE
Leukocytes,UA: NEGATIVE
Nitrite, UA: NEGATIVE
Protein,UA: NEGATIVE
RBC, UA: NEGATIVE
Specific Gravity, UA: 1.008 (ref 1.005–1.030)
Urobilinogen, Ur: 0.2 mg/dL (ref 0.2–1.0)
pH, UA: 6.5 (ref 5.0–7.5)

## 2023-07-04 LAB — MICROSCOPIC EXAMINATION
Bacteria, UA: NONE SEEN
Casts: NONE SEEN /LPF
Epithelial Cells (non renal): NONE SEEN /HPF (ref 0–10)
WBC, UA: NONE SEEN /HPF (ref 0–5)

## 2023-07-07 DIAGNOSIS — E782 Mixed hyperlipidemia: Secondary | ICD-10-CM | POA: Diagnosis not present

## 2023-07-07 DIAGNOSIS — G43109 Migraine with aura, not intractable, without status migrainosus: Secondary | ICD-10-CM | POA: Diagnosis not present

## 2023-07-07 DIAGNOSIS — Z0001 Encounter for general adult medical examination with abnormal findings: Secondary | ICD-10-CM | POA: Diagnosis not present

## 2023-07-08 LAB — CBC WITH DIFFERENTIAL/PLATELET
Basophils Absolute: 0.1 10*3/uL (ref 0.0–0.2)
Basos: 2 %
EOS (ABSOLUTE): 0.2 10*3/uL (ref 0.0–0.4)
Eos: 3 %
Hematocrit: 39.5 % (ref 34.0–46.6)
Hemoglobin: 13.3 g/dL (ref 11.1–15.9)
Immature Grans (Abs): 0 10*3/uL (ref 0.0–0.1)
Immature Granulocytes: 0 %
Lymphocytes Absolute: 2.5 10*3/uL (ref 0.7–3.1)
Lymphs: 44 %
MCH: 31 pg (ref 26.6–33.0)
MCHC: 33.7 g/dL (ref 31.5–35.7)
MCV: 92 fL (ref 79–97)
Monocytes Absolute: 0.6 10*3/uL (ref 0.1–0.9)
Monocytes: 10 %
Neutrophils Absolute: 2.2 10*3/uL (ref 1.4–7.0)
Neutrophils: 41 %
Platelets: 336 10*3/uL (ref 150–450)
RBC: 4.29 x10E6/uL (ref 3.77–5.28)
RDW: 12.6 % (ref 11.7–15.4)
WBC: 5.5 10*3/uL (ref 3.4–10.8)

## 2023-07-08 LAB — CMP14+EGFR
ALT: 25 IU/L (ref 0–32)
AST: 30 IU/L (ref 0–40)
Albumin: 4.3 g/dL (ref 4.0–5.0)
Alkaline Phosphatase: 57 IU/L (ref 44–121)
BUN/Creatinine Ratio: 14 (ref 9–23)
BUN: 12 mg/dL (ref 6–20)
Bilirubin Total: 0.4 mg/dL (ref 0.0–1.2)
CO2: 21 mmol/L (ref 20–29)
Calcium: 9.5 mg/dL (ref 8.7–10.2)
Chloride: 107 mmol/L — ABNORMAL HIGH (ref 96–106)
Creatinine, Ser: 0.86 mg/dL (ref 0.57–1.00)
Globulin, Total: 2.6 g/dL (ref 1.5–4.5)
Glucose: 88 mg/dL (ref 70–99)
Potassium: 4.1 mmol/L (ref 3.5–5.2)
Sodium: 141 mmol/L (ref 134–144)
Total Protein: 6.9 g/dL (ref 6.0–8.5)
eGFR: 97 mL/min/{1.73_m2} (ref >59)

## 2023-07-08 LAB — LIPID PANEL
Chol/HDL Ratio: 2.8 ratio (ref 0.0–4.4)
Cholesterol, Total: 138 mg/dL (ref 100–199)
HDL: 50 mg/dL (ref >39)
LDL Chol Calc (NIH): 69 mg/dL (ref 0–99)
Triglycerides: 106 mg/dL (ref 0–149)
VLDL Cholesterol Cal: 19 mg/dL (ref 5–40)

## 2023-07-17 DIAGNOSIS — F330 Major depressive disorder, recurrent, mild: Secondary | ICD-10-CM | POA: Diagnosis not present

## 2023-07-17 DIAGNOSIS — F411 Generalized anxiety disorder: Secondary | ICD-10-CM | POA: Diagnosis not present

## 2023-07-24 DIAGNOSIS — F411 Generalized anxiety disorder: Secondary | ICD-10-CM | POA: Diagnosis not present

## 2023-07-24 DIAGNOSIS — F330 Major depressive disorder, recurrent, mild: Secondary | ICD-10-CM | POA: Diagnosis not present

## 2023-08-06 ENCOUNTER — Telehealth: Payer: Self-pay

## 2023-08-06 NOTE — Telephone Encounter (Signed)
Grandma called for zomig refills I called phar I should be ready soon they will notified when ready and lmom for grandma

## 2023-08-29 DIAGNOSIS — F4323 Adjustment disorder with mixed anxiety and depressed mood: Secondary | ICD-10-CM | POA: Diagnosis not present

## 2023-09-12 DIAGNOSIS — F4323 Adjustment disorder with mixed anxiety and depressed mood: Secondary | ICD-10-CM | POA: Diagnosis not present

## 2023-09-19 DIAGNOSIS — F4323 Adjustment disorder with mixed anxiety and depressed mood: Secondary | ICD-10-CM | POA: Diagnosis not present

## 2023-09-24 DIAGNOSIS — F4323 Adjustment disorder with mixed anxiety and depressed mood: Secondary | ICD-10-CM | POA: Diagnosis not present

## 2023-09-28 ENCOUNTER — Other Ambulatory Visit: Payer: Self-pay | Admitting: Physician Assistant

## 2023-09-28 DIAGNOSIS — G43109 Migraine with aura, not intractable, without status migrainosus: Secondary | ICD-10-CM

## 2023-10-03 DIAGNOSIS — F4323 Adjustment disorder with mixed anxiety and depressed mood: Secondary | ICD-10-CM | POA: Diagnosis not present

## 2023-10-13 ENCOUNTER — Encounter: Payer: Self-pay | Admitting: Nurse Practitioner

## 2023-10-13 ENCOUNTER — Ambulatory Visit (INDEPENDENT_AMBULATORY_CARE_PROVIDER_SITE_OTHER): Payer: BC Managed Care – PPO | Admitting: Nurse Practitioner

## 2023-10-13 VITALS — BP 130/86 | HR 82 | Temp 98.5°F | Resp 16 | Ht 67.0 in | Wt 204.0 lb

## 2023-10-13 DIAGNOSIS — R221 Localized swelling, mass and lump, neck: Secondary | ICD-10-CM | POA: Diagnosis not present

## 2023-10-13 NOTE — Progress Notes (Signed)
[facility] [address] [place], [place] [address]  Internal MEDICINE  Office Visit Note  Patient Name: Claudia Melendez  [id number]  [id number]  Date of Service: 10/13/2023  Chief Complaint  Patient presents with   Acute Visit    Lump on throat. Right side     HPI Echoe presents for an acute sick visit for lump in right side of neck  --visually noticed possibly 1-2 months ago. Palpated by patient 2 days ago for the first time.  Round, soft, movable mass, not painful, nontender.      Current Medication:  Outpatient Encounter Medications as of 10/13/2023  Medication Sig   busPIRone (BUSPAR) 5 MG tablet Take 1 tablet (5 mg total) by mouth 3 (three) times daily.   Norgestimate-Ethinyl Estradiol Triphasic (TRI-LO-MARZIA) 0.18/0.215/0.25 MG-25 MCG tab Take 1 tablet by mouth daily.   oxymetazoline (AFRIN) 0.05 % nasal spray Use 2 sprays into affected nostril as needed for active nosebleed, may repeat x1 after 15 minutes.   pseudoephedrine (SUDAFED) 30 MG tablet Take 30 mg by mouth every 4 (four) hours as needed for congestion.   Tdap (ADACEL) 02-26-14.5 LF-MCG/0.5 injection Inject 0.5 mLs into the muscle once.   topiramate (TOPAMAX) 50 MG tablet Take 1 tablet (50 mg total) by mouth 2 (two) times daily.   ZOLMitriptan (ZOMIG) 2.5 MG tablet TAKE 1 TABLET BY MOUTH AS NEEDED FOR MIGRAINE OR HEADACHE. MAY REPEAT IN 2 HOURS IF HEADACHE PERSISTS OR RECURS.   No facility-administered encounter medications on file as of 10/13/2023.      Medical History: Past Medical History:  Diagnosis Date   Allergies    B12 deficiency    Iron (Fe) deficiency anemia    Migraines    Vitamin D deficiency      Vital Signs: BP 130/86   Pulse 82   Temp 98.5 F (36.9 C)   Resp 16   Ht 5\' 7"  (1.702 m)   Wt 204 lb (92.5 kg)   SpO2 98%   BMI 31.95 kg/m    Review of Systems  Respiratory: Negative.  Negative for cough, chest tightness, shortness of breath and wheezing.    Cardiovascular: Negative.  Negative for chest pain and palpitations.  Skin:        Lump on right side of neck    Physical Exam Vitals reviewed.  Neck:      Comments: Palpated small movable, round lump on right side of neck, nontender, possibly a submandibular lymph node but will have imaging done to confirm.       Assessment/Plan: 1. Localized swelling, mass or lump of neck (Primary) Ultrasound ordered, follow up for results  - US Soft Tissue Head/Neck (NON-THYROID); Future   General Counseling: khanh felicia understanding of the findings of todays visit and agrees with plan of treatment. I have discussed any further diagnostic evaluation that may be needed or ordered today. We also reviewed her medications today. she has been encouraged to call the office with any questions or concerns that should arise related to todays visit.    Counseling:    Orders Placed This Encounter  Procedures   US Soft Tissue Head/Neck (NON-THYROID)    No orders of the defined types were placed in this encounter.   Return for F/U, Quianna Avery PCP for ultrasound results. .  [place] Controlled Substance Database was reviewed by me for overdose risk score (ORS)  Time spent:20 Minutes Time spent with patient included reviewing progress notes, labs, imaging studies, and discussing plan  for follow up.   This patient was seen by Sallyanne Kuster, FNP-C in collaboration with Dr. Beverely Risen as a part of collaborative care agreement.  Shanette Tamargo R. Tedd Sias, MSN, FNP-C Internal Medicine

## 2023-10-16 ENCOUNTER — Ambulatory Visit
Admission: RE | Admit: 2023-10-16 | Discharge: 2023-10-16 | Disposition: A | Discharge status: Home / Self Care | Payer: BC Managed Care – PPO | Source: Ambulatory Visit | Attending: Nurse Practitioner | Admitting: Nurse Practitioner

## 2023-10-16 DIAGNOSIS — R590 Localized enlarged lymph nodes: Secondary | ICD-10-CM | POA: Diagnosis not present

## 2023-10-16 DIAGNOSIS — R221 Localized swelling, mass and lump, neck: Secondary | ICD-10-CM

## 2023-10-17 DIAGNOSIS — F4323 Adjustment disorder with mixed anxiety and depressed mood: Secondary | ICD-10-CM | POA: Diagnosis not present

## 2023-10-31 DIAGNOSIS — F4323 Adjustment disorder with mixed anxiety and depressed mood: Secondary | ICD-10-CM | POA: Diagnosis not present

## 2023-11-10 ENCOUNTER — Ambulatory Visit: Payer: BC Managed Care – PPO | Admitting: Nurse Practitioner

## 2023-11-14 DIAGNOSIS — F4323 Adjustment disorder with mixed anxiety and depressed mood: Secondary | ICD-10-CM | POA: Diagnosis not present

## 2023-11-21 DIAGNOSIS — F4323 Adjustment disorder with mixed anxiety and depressed mood: Secondary | ICD-10-CM | POA: Diagnosis not present

## 2023-11-28 DIAGNOSIS — F4323 Adjustment disorder with mixed anxiety and depressed mood: Secondary | ICD-10-CM | POA: Diagnosis not present

## 2023-12-12 DIAGNOSIS — F4323 Adjustment disorder with mixed anxiety and depressed mood: Secondary | ICD-10-CM | POA: Diagnosis not present

## 2023-12-19 DIAGNOSIS — F4323 Adjustment disorder with mixed anxiety and depressed mood: Secondary | ICD-10-CM | POA: Diagnosis not present

## 2023-12-26 DIAGNOSIS — F4323 Adjustment disorder with mixed anxiety and depressed mood: Secondary | ICD-10-CM | POA: Diagnosis not present

## 2024-01-02 DIAGNOSIS — F4323 Adjustment disorder with mixed anxiety and depressed mood: Secondary | ICD-10-CM | POA: Diagnosis not present

## 2024-01-09 DIAGNOSIS — F4323 Adjustment disorder with mixed anxiety and depressed mood: Secondary | ICD-10-CM | POA: Diagnosis not present

## 2024-01-11 ENCOUNTER — Other Ambulatory Visit: Payer: Self-pay | Admitting: Nurse Practitioner

## 2024-01-11 DIAGNOSIS — G43109 Migraine with aura, not intractable, without status migrainosus: Secondary | ICD-10-CM

## 2024-01-21 ENCOUNTER — Other Ambulatory Visit: Payer: Self-pay | Admitting: Nurse Practitioner

## 2024-01-21 DIAGNOSIS — G43109 Migraine with aura, not intractable, without status migrainosus: Secondary | ICD-10-CM

## 2024-01-21 MED ORDER — ZOLMITRIPTAN 2.5 MG PO TABS: 2.5000 mg | ORAL_TABLET | Freq: Every day | ORAL | 4 refills | Status: DC | PRN

## 2024-01-21 MED ORDER — NURTEC 75 MG PO TBDP: 75.0000 mg | ORAL_TABLET | ORAL | 5 refills | Status: DC

## 2024-01-23 DIAGNOSIS — F4323 Adjustment disorder with mixed anxiety and depressed mood: Secondary | ICD-10-CM | POA: Diagnosis not present

## 2024-02-04 DIAGNOSIS — M24811 Other specific joint derangements of right shoulder, not elsewhere classified: Secondary | ICD-10-CM | POA: Diagnosis not present

## 2024-02-05 DIAGNOSIS — M25511 Pain in right shoulder: Secondary | ICD-10-CM | POA: Diagnosis not present

## 2024-02-05 DIAGNOSIS — M24811 Other specific joint derangements of right shoulder, not elsewhere classified: Secondary | ICD-10-CM | POA: Diagnosis not present

## 2024-02-10 DIAGNOSIS — M25511 Pain in right shoulder: Secondary | ICD-10-CM | POA: Diagnosis not present

## 2024-02-13 DIAGNOSIS — S4351XD Sprain of right acromioclavicular joint, subsequent encounter: Secondary | ICD-10-CM | POA: Diagnosis not present

## 2024-02-20 DIAGNOSIS — F4323 Adjustment disorder with mixed anxiety and depressed mood: Secondary | ICD-10-CM | POA: Diagnosis not present

## 2024-03-05 DIAGNOSIS — F4323 Adjustment disorder with mixed anxiety and depressed mood: Secondary | ICD-10-CM | POA: Diagnosis not present

## 2024-03-07 ENCOUNTER — Other Ambulatory Visit: Payer: Self-pay | Admitting: Nurse Practitioner

## 2024-03-07 DIAGNOSIS — N921 Excessive and frequent menstruation with irregular cycle: Secondary | ICD-10-CM

## 2024-03-08 NOTE — Telephone Encounter (Signed)
 Please review

## 2024-03-11 DIAGNOSIS — S4351XD Sprain of right acromioclavicular joint, subsequent encounter: Secondary | ICD-10-CM | POA: Diagnosis not present

## 2024-03-13 DIAGNOSIS — F4323 Adjustment disorder with mixed anxiety and depressed mood: Secondary | ICD-10-CM | POA: Diagnosis not present

## 2024-04-10 DIAGNOSIS — F4323 Adjustment disorder with mixed anxiety and depressed mood: Secondary | ICD-10-CM | POA: Diagnosis not present

## 2024-04-16 DIAGNOSIS — F4323 Adjustment disorder with mixed anxiety and depressed mood: Secondary | ICD-10-CM | POA: Diagnosis not present

## 2024-04-24 DIAGNOSIS — F4323 Adjustment disorder with mixed anxiety and depressed mood: Secondary | ICD-10-CM | POA: Diagnosis not present

## 2024-05-09 DIAGNOSIS — F4323 Adjustment disorder with mixed anxiety and depressed mood: Secondary | ICD-10-CM | POA: Diagnosis not present

## 2024-05-14 DIAGNOSIS — F4323 Adjustment disorder with mixed anxiety and depressed mood: Secondary | ICD-10-CM | POA: Diagnosis not present

## 2024-05-28 DIAGNOSIS — F4323 Adjustment disorder with mixed anxiety and depressed mood: Secondary | ICD-10-CM | POA: Diagnosis not present

## 2024-06-04 DIAGNOSIS — F4323 Adjustment disorder with mixed anxiety and depressed mood: Secondary | ICD-10-CM | POA: Diagnosis not present

## 2024-06-11 DIAGNOSIS — F4323 Adjustment disorder with mixed anxiety and depressed mood: Secondary | ICD-10-CM | POA: Diagnosis not present

## 2024-06-25 DIAGNOSIS — F4323 Adjustment disorder with mixed anxiety and depressed mood: Secondary | ICD-10-CM | POA: Diagnosis not present

## 2024-07-02 DIAGNOSIS — F4323 Adjustment disorder with mixed anxiety and depressed mood: Secondary | ICD-10-CM | POA: Diagnosis not present

## 2024-07-06 ENCOUNTER — Ambulatory Visit: Payer: BC Managed Care – PPO | Admitting: Nurse Practitioner

## 2024-07-06 ENCOUNTER — Encounter: Payer: Self-pay | Admitting: Nurse Practitioner

## 2024-07-06 ENCOUNTER — Other Ambulatory Visit: Payer: Self-pay | Admitting: Nurse Practitioner

## 2024-07-06 VITALS — BP 130/84 | HR 87 | Temp 98.1°F | Resp 16 | Ht 67.0 in | Wt 202.4 lb

## 2024-07-06 DIAGNOSIS — F418 Other specified anxiety disorders: Secondary | ICD-10-CM

## 2024-07-06 DIAGNOSIS — G43109 Migraine with aura, not intractable, without status migrainosus: Secondary | ICD-10-CM

## 2024-07-06 DIAGNOSIS — Z23 Encounter for immunization: Secondary | ICD-10-CM | POA: Diagnosis not present

## 2024-07-06 DIAGNOSIS — Z0001 Encounter for general adult medical examination with abnormal findings: Secondary | ICD-10-CM | POA: Diagnosis not present

## 2024-07-06 MED ORDER — ZOLMITRIPTAN 5 MG PO TABS: 5.0000 mg | ORAL_TABLET | ORAL | 0 refills | Status: DC | PRN

## 2024-07-06 MED ORDER — BUSPIRONE HCL 5 MG PO TABS: 5.0000 mg | ORAL_TABLET | Freq: Three times a day (TID) | ORAL | 3 refills | Status: AC

## 2024-07-06 MED ORDER — NOVAVAX COVID-19 VACCINE 5 MCG/0.5ML IM SUSY: 5.0000 ug | PREFILLED_SYRINGE | Freq: Once | INTRAMUSCULAR | 0 refills | Status: AC | PRN

## 2024-07-06 MED ORDER — ZOLMITRIPTAN 5 MG PO TABS: 5.0000 mg | ORAL_TABLET | Freq: Every day | ORAL | 0 refills | Status: AC | PRN

## 2024-07-06 NOTE — Progress Notes (Signed)
 [facility] [address] [place], [place] [address]  Internal MEDICINE  Office Visit Note  Patient Name: Claudia Melendez  [id number]  [id number]  Date of Service: 07/06/2024  Chief Complaint  Patient presents with   Annual Exam    HPI Claudia Melendez presents for an annual well visit and physical exam.  Well-appearing 28 y.o. female with migraines, anxiety, and history of depression  Pap smear: due now but is on cycle today, will plan to do this in the future once her insurance is fixed.  Labs: deferred for now, will be losing insurance and looking for new health insurance  New or worsening pain: none  Other concerns: none  Migraines vs. Cluster headaches, needs to see neurology.    Current Medication: Outpatient Encounter Medications as of 07/06/2024  Medication Sig   COVID-19 Subunit Vacc-Novavax (NOVAVAX COVID-19 VACCINE ) 5 MCG/0.5ML SUSY Inject 5 mcg into the muscle once as needed for up to 1 dose (covid vaccination).   zolmitriptan  (ZOMIG ) 5 MG tablet Take 1 tablet (5 mg total) by mouth as needed for migraine.   busPIRone  (BUSPAR ) 5 MG tablet Take 1 tablet (5 mg total) by mouth 3 (three) times daily.   oxymetazoline  (AFRIN) 0.05 % nasal spray Use 2 sprays into affected nostril as needed for active nosebleed, may repeat x1 after 15 minutes.   pseudoephedrine (SUDAFED) 30 MG tablet Take 30 mg by mouth every 4 (four) hours as needed for congestion.   Tdap (ADACEL) 02-26-14.5 LF-MCG/0.5 injection Inject 0.5 mLs into the muscle once.   topiramate  (TOPAMAX ) 50 MG tablet TAKE 1 TABLET BY MOUTH TWICE A DAY   TRI-LO-MARZIA 0.18/0.215/0.25 MG-25 MCG TABS TAKE 1 TABLET BY MOUTH EVERY DAY   [DISCONTINUED] busPIRone  (BUSPAR ) 5 MG tablet Take 1 tablet (5 mg total) by mouth 3 (three) times daily.   [DISCONTINUED] Rimegepant Sulfate (NURTEC) 75 MG TBDP Take 1 tablet (75 mg total) by mouth every other day.   [DISCONTINUED] ZOLMitriptan  (ZOMIG ) 2.5 MG tablet Take 1 tablet (2.5 mg total) by  mouth daily as needed for migraine or headache. May repeat in 2 hours if headache persists or recurs.   No facility-administered encounter medications on file as of 07/06/2024.    Surgical History: Past Surgical History:  Procedure Laterality Date   corrective eye surgery     SEPTOPLASTY N/A 08/14/2022   Procedure: SEPTOPLASTY;  Surgeon: Milissa Hamming, MD;  Location: [facility];  Service: ENT;  Laterality: N/A;   toncillectomy     TURBINATE REDUCTION Bilateral 08/14/2022   Procedure: TURBINATE REDUCTION;  Surgeon: Milissa Hamming, MD;  Location: [facility];  Service: ENT;  Laterality: Bilateral;    Medical History: Past Medical History:  Diagnosis Date   Allergies    B12 deficiency    Iron (Fe) deficiency anemia    Migraines    Vitamin D deficiency     Family History: Family History  Problem Relation Age of Onset   Cancer Father        mouth   Colon cancer Paternal Grandmother     Social History   Socioeconomic History   Marital status: Single    Spouse name: Not on file   Number of children: Not on file   Years of education: Not on file   Highest education level: Not on file  Occupational History   Not on file  Tobacco Use   Smoking status: Never   Smokeless tobacco: Never  Vaping Use   Vaping status: Never Used  Substance and Sexual Activity   Alcohol use: No   Drug use: No   Sexual activity: Not on file  Other Topics Concern   Not on file  Social History Narrative   Not on file   Social Drivers of Health   Financial Resource Strain: Not on file  Food Insecurity: Not on file  Transportation Needs: Not on file  Physical Activity: Not on file  Stress: Not on file  Social Connections: Not on file  Intimate Partner Violence: Not on file      Review of Systems  Constitutional: Negative.  Negative for activity change, appetite change, chills, fatigue, fever and unexpected weight change.  HENT:  Positive for congestion,  postnasal drip, rhinorrhea and sneezing. Negative for ear pain, nosebleeds, sore throat and trouble swallowing.   Eyes: Negative.  Negative for pain.  Respiratory: Negative.  Negative for cough, chest tightness, shortness of breath and wheezing.   Cardiovascular: Negative.  Negative for chest pain and palpitations.  Gastrointestinal: Negative.  Negative for abdominal pain, blood in stool, constipation, diarrhea, nausea and vomiting.  Endocrine: Negative.   Genitourinary: Negative.  Negative for difficulty urinating, dysuria, frequency, hematuria and urgency.  Musculoskeletal: Negative.  Negative for arthralgias, back pain, joint swelling, myalgias and neck pain.  Skin: Negative.  Negative for rash and wound.  Allergic/Immunologic: Negative.  Negative for immunocompromised state.  Neurological:  Positive for headaches. Negative for dizziness, seizures and numbness.  Hematological: Negative.   Psychiatric/Behavioral:  Negative for behavioral problems, self-injury, sleep disturbance and suicidal ideas. The patient is nervous/anxious.     Vital Signs: BP 130/84   Pulse 87   Temp 98.1 F (36.7 C)   Resp 16   Ht 5' 7 (1.702 m)   Wt 202 lb 6.4 oz (91.8 kg)   SpO2 97%   BMI 31.70 kg/m    Physical Exam Vitals reviewed.  Constitutional:      General: She is not in acute distress.    Appearance: Normal appearance. She is well-developed. She is obese. She is not ill-appearing or diaphoretic.  HENT:     Head: Normocephalic and atraumatic.     Right Ear: Tympanic membrane, ear canal and external ear normal.     Left Ear: Tympanic membrane, ear canal and external ear normal.     Nose: No nasal deformity, septal deviation, signs of injury, mucosal edema, congestion or rhinorrhea.     Right Nostril: No epistaxis, septal hematoma or occlusion.     Left Nostril: No epistaxis, septal hematoma or occlusion.     Right Turbinates: Not swollen or pale.     Left Turbinates: Not swollen or pale.      Right Sinus: No maxillary sinus tenderness or frontal sinus tenderness.     Left Sinus: No maxillary sinus tenderness or frontal sinus tenderness.     Mouth/Throat:     Mouth: Mucous membranes are moist.     Pharynx: Oropharynx is clear. No oropharyngeal exudate or posterior oropharyngeal erythema.  Eyes:     General: No scleral icterus.       Right eye: No discharge.        Left eye: No discharge.     Extraocular Movements: Extraocular movements intact.     Conjunctiva/sclera: Conjunctivae normal.     Pupils: Pupils are equal, round, and reactive to light.  Neck:     Thyroid : No thyromegaly.     Vascular: No JVD.     Trachea: No tracheal deviation.  Cardiovascular:  Rate and Rhythm: Normal rate and regular rhythm.     Pulses: Normal pulses.     Heart sounds: Normal heart sounds. No murmur heard.    No friction rub. No gallop.  Pulmonary:     Effort: Pulmonary effort is normal. No respiratory distress.     Breath sounds: Normal breath sounds. No stridor. No wheezing or rales.  Chest:     Chest wall: No mass, lacerations, deformity, swelling or tenderness.  Breasts:    Breasts are symmetrical.     Right: Normal. No swelling, inverted nipple, mass, nipple discharge, skin change or tenderness.     Left: Normal. No swelling, inverted nipple, mass, nipple discharge, skin change or tenderness.  Abdominal:     General: Bowel sounds are normal. There is no distension.     Palpations: Abdomen is soft. There is no mass.     Tenderness: There is no abdominal tenderness. There is no guarding or rebound.  Musculoskeletal:        General: No tenderness or deformity. Normal range of motion.     Cervical back: Normal range of motion and neck supple.  Lymphadenopathy:     Cervical: No cervical adenopathy.     Upper Body:     Right upper body: No supraclavicular, axillary or pectoral adenopathy.     Left upper body: No supraclavicular, axillary or pectoral adenopathy.  Skin:    General:  Skin is warm and dry.     Capillary Refill: Capillary refill takes less than 2 seconds.     Coloration: Skin is not pale.     Findings: No erythema or rash.  Neurological:     Mental Status: She is alert and oriented to person, place, and time.     Cranial Nerves: No cranial nerve deficit.     Motor: No abnormal muscle tone.     Coordination: Coordination normal.     Gait: Gait normal.     Deep Tendon Reflexes: Reflexes are normal and symmetric.  Psychiatric:        Mood and Affect: Mood normal.        Behavior: Behavior normal.        Thought Content: Thought content normal.        Judgment: Judgment normal.        Assessment/Plan: 1. Encounter for routine adult health examination with abnormal findings Age-appropriate preventive screenings and vaccinations discussed, annual physical exam completed. Routine labs for health maintenance deferred until she gets her new insurance. PHM updated.    2. Migraine with aura and without status migrainosus, not intractable (Primary) Zolmitriptan  dose increased.  - zolmitriptan  (ZOMIG ) 5 MG tablet; Take 1 tablet (5 mg total) by mouth daily as needed for migraine. May take 1 additional tablet 2 hours later if symptoms persist.  Dispense: 15 tablet; Refill: 0  3. Situational anxiety Continue buspirone  as prescribed.  - busPIRone  (BUSPAR ) 5 MG tablet; Take 1 tablet (5 mg total) by mouth 3 (three) times daily.  Dispense: 270 tablet; Refill: 3  4. Need for COVID-19 vaccine - COVID-19 Subunit Vacc-Novavax (NOVAVAX COVID-19 VACCINE ) 5 MCG/0.5ML SUSY; Inject 5 mcg into the muscle once as needed for up to 1 dose (covid vaccination).  Dispense: 0.5 mL; Refill: 0      General Counseling: Lenyx verbalizes understanding of the findings of todays visit and agrees with plan of treatment. I have discussed any further diagnostic evaluation that may be needed or ordered today. We also reviewed her medications today. she has  been encouraged to call the  office with any questions or concerns that should arise related to todays visit.    No orders of the defined types were placed in this encounter.   Meds ordered this encounter  Medications   zolmitriptan  (ZOMIG ) 5 MG tablet    Sig: Take 1 tablet (5 mg total) by mouth as needed for migraine.    Dispense:  10 tablet    Refill:  0   busPIRone  (BUSPAR ) 5 MG tablet    Sig: Take 1 tablet (5 mg total) by mouth 3 (three) times daily.    Dispense:  270 tablet    Refill:  3   COVID-19 Subunit Vacc-Novavax (NOVAVAX COVID-19 VACCINE ) 5 MCG/0.5ML SUSY    Sig: Inject 5 mcg into the muscle once as needed for up to 1 dose (covid vaccination).    Dispense:  0.5 mL    Refill:  0    Please use current strain of vaccine thanks    Return in about 1 year (around 07/06/2025) for CPE, Jaylin Benzel PCP and plan to schedule f/u for pap and labs once her insurance changes. .   Total time spent:30 Minutes Time spent includes review of chart, medications, test results, and follow up plan with the patient.   [place] Controlled Substance Database was reviewed by me.  This patient was seen by Mardy Maxin, FNP-C in collaboration with Dr. Sigrid Bathe as a part of collaborative care agreement.  Christabell Loseke R. Maxin, MSN, FNP-C Internal medicine

## 2024-07-09 ENCOUNTER — Encounter: Payer: Self-pay | Admitting: Nurse Practitioner

## 2024-07-09 DIAGNOSIS — F4323 Adjustment disorder with mixed anxiety and depressed mood: Secondary | ICD-10-CM | POA: Diagnosis not present

## 2024-07-09 MED ORDER — COVID-19 MRNA VAC-TRIS(PFIZER) 30 MCG/0.3ML IM SUSY: 0.3000 mL | PREFILLED_SYRINGE | Freq: Once | INTRAMUSCULAR | 0 refills | Status: AC

## 2024-07-16 DIAGNOSIS — F4323 Adjustment disorder with mixed anxiety and depressed mood: Secondary | ICD-10-CM | POA: Diagnosis not present

## 2024-07-23 DIAGNOSIS — F4323 Adjustment disorder with mixed anxiety and depressed mood: Secondary | ICD-10-CM | POA: Diagnosis not present

## 2024-07-25 ENCOUNTER — Other Ambulatory Visit: Payer: Self-pay | Admitting: Nurse Practitioner

## 2024-07-25 DIAGNOSIS — G43109 Migraine with aura, not intractable, without status migrainosus: Secondary | ICD-10-CM

## 2025-07-07 ENCOUNTER — Encounter: Admitting: Nurse Practitioner
